# Patient Record
Sex: Female | Born: 1970 | Race: Black or African American | Hispanic: No | Marital: Married | Smoking: Never smoker
Health system: Southern US, Community
[De-identification: ages and names within clinical notes are randomized; demographics above are authoritative.]

## PROBLEM LIST (undated history)

## (undated) DIAGNOSIS — Z800 Family history of malignant neoplasm of digestive organs: Secondary | ICD-10-CM

## (undated) DIAGNOSIS — Z1371 Encounter for nonprocreative screening for genetic disease carrier status: Secondary | ICD-10-CM

## (undated) DIAGNOSIS — I10 Essential (primary) hypertension: Secondary | ICD-10-CM

## (undated) DIAGNOSIS — Z9289 Personal history of other medical treatment: Secondary | ICD-10-CM

## (undated) DIAGNOSIS — E119 Type 2 diabetes mellitus without complications: Secondary | ICD-10-CM

## (undated) DIAGNOSIS — E559 Vitamin D deficiency, unspecified: Secondary | ICD-10-CM

## (undated) DIAGNOSIS — Z8041 Family history of malignant neoplasm of ovary: Secondary | ICD-10-CM

## (undated) HISTORY — DX: Personal history of other medical treatment: Z92.89

## (undated) HISTORY — DX: Family history of malignant neoplasm of ovary: Z80.41

## (undated) HISTORY — DX: Encounter for nonprocreative screening for genetic disease carrier status: Z13.71

## (undated) HISTORY — DX: Family history of malignant neoplasm of digestive organs: Z80.0

## (undated) HISTORY — DX: Vitamin D deficiency, unspecified: E55.9

## (undated) HISTORY — PX: NOVASURE ABLATION: SHX5394

## (undated) HISTORY — PX: TUBAL LIGATION: SHX77

---

## 2002-04-21 HISTORY — PX: INTRAUTERINE DEVICE (IUD) INSERTION: SHX5877

## 2006-11-28 ENCOUNTER — Emergency Department: Payer: Self-pay | Admitting: Emergency Medicine

## 2007-08-20 ENCOUNTER — Emergency Department: Payer: Self-pay | Admitting: Emergency Medicine

## 2007-10-19 ENCOUNTER — Encounter: Payer: Self-pay | Admitting: General Practice

## 2007-10-20 ENCOUNTER — Encounter: Payer: Self-pay | Admitting: General Practice

## 2007-12-21 ENCOUNTER — Ambulatory Visit: Payer: Self-pay

## 2008-01-19 ENCOUNTER — Ambulatory Visit: Payer: Self-pay | Admitting: Pain Medicine

## 2008-01-25 ENCOUNTER — Ambulatory Visit: Payer: Self-pay | Admitting: Pain Medicine

## 2008-02-08 ENCOUNTER — Ambulatory Visit: Payer: Self-pay | Admitting: Physician Assistant

## 2011-09-20 DIAGNOSIS — E559 Vitamin D deficiency, unspecified: Secondary | ICD-10-CM

## 2011-09-20 DIAGNOSIS — E119 Type 2 diabetes mellitus without complications: Secondary | ICD-10-CM

## 2011-09-20 HISTORY — DX: Type 2 diabetes mellitus without complications: E11.9

## 2011-09-20 HISTORY — DX: Vitamin D deficiency, unspecified: E55.9

## 2011-12-03 ENCOUNTER — Emergency Department: Payer: Self-pay | Admitting: Unknown Physician Specialty

## 2013-08-23 ENCOUNTER — Emergency Department: Payer: Self-pay | Admitting: Emergency Medicine

## 2013-08-23 LAB — CBC WITH DIFFERENTIAL/PLATELET
Basophil #: 0 10*3/uL (ref 0.0–0.1)
Basophil %: 0.3 %
EOS ABS: 0 10*3/uL (ref 0.0–0.7)
EOS PCT: 0.3 %
HCT: 41.7 % (ref 35.0–47.0)
HGB: 13.7 g/dL (ref 12.0–16.0)
LYMPHS ABS: 1.9 10*3/uL (ref 1.0–3.6)
Lymphocyte %: 11.8 %
MCH: 31.5 pg (ref 26.0–34.0)
MCHC: 33 g/dL (ref 32.0–36.0)
MCV: 96 fL (ref 80–100)
Monocyte #: 0.7 x10 3/mm (ref 0.2–0.9)
Monocyte %: 4.3 %
NEUTROS ABS: 13.5 10*3/uL — AB (ref 1.4–6.5)
Neutrophil %: 83.3 %
PLATELETS: 248 10*3/uL (ref 150–440)
RBC: 4.36 10*6/uL (ref 3.80–5.20)
RDW: 12.6 % (ref 11.5–14.5)
WBC: 16.2 10*3/uL — ABNORMAL HIGH (ref 3.6–11.0)

## 2013-08-23 LAB — URINALYSIS, COMPLETE
BACTERIA: NONE SEEN
BLOOD: NEGATIVE
Bilirubin,UR: NEGATIVE
Glucose,UR: >=500 mg/dL (ref 0–75)
Leukocyte Esterase: NEGATIVE
NITRITE: NEGATIVE
Ph: 5 (ref 4.5–8.0)
Protein: 30
RBC,UR: 1 /HPF (ref 0–5)
SPECIFIC GRAVITY: 1.031 (ref 1.003–1.030)
WBC UR: 2 /HPF (ref 0–5)

## 2013-08-23 LAB — COMPREHENSIVE METABOLIC PANEL
ALK PHOS: 56 U/L
Albumin: 3.5 g/dL (ref 3.4–5.0)
Anion Gap: 7 (ref 7–16)
BILIRUBIN TOTAL: 0.5 mg/dL (ref 0.2–1.0)
BUN: 13 mg/dL (ref 7–18)
CALCIUM: 8.6 mg/dL (ref 8.5–10.1)
CHLORIDE: 105 mmol/L (ref 98–107)
CO2: 25 mmol/L (ref 21–32)
Creatinine: 0.79 mg/dL (ref 0.60–1.30)
Glucose: 232 mg/dL — ABNORMAL HIGH (ref 65–99)
OSMOLALITY: 281 (ref 275–301)
Potassium: 3.7 mmol/L (ref 3.5–5.1)
SGOT(AST): 20 U/L (ref 15–37)
SGPT (ALT): 15 U/L (ref 12–78)
Sodium: 137 mmol/L (ref 136–145)
Total Protein: 7.2 g/dL (ref 6.4–8.2)

## 2013-08-23 LAB — LIPASE, BLOOD: Lipase: 130 U/L (ref 73–393)

## 2013-10-31 LAB — HM MAMMOGRAPHY

## 2013-10-31 LAB — HM PAP SMEAR

## 2016-12-13 ENCOUNTER — Encounter: Payer: Self-pay | Admitting: Emergency Medicine

## 2016-12-13 ENCOUNTER — Emergency Department
Admission: EM | Admit: 2016-12-13 | Discharge: 2016-12-13 | Disposition: A | Discharge status: Home / Self Care | Payer: No Typology Code available for payment source | Attending: Emergency Medicine | Admitting: Emergency Medicine

## 2016-12-13 DIAGNOSIS — M545 Low back pain: Secondary | ICD-10-CM | POA: Diagnosis present

## 2016-12-13 DIAGNOSIS — E119 Type 2 diabetes mellitus without complications: Secondary | ICD-10-CM | POA: Diagnosis not present

## 2016-12-13 DIAGNOSIS — M7918 Myalgia, other site: Secondary | ICD-10-CM

## 2016-12-13 DIAGNOSIS — M791 Myalgia: Secondary | ICD-10-CM | POA: Diagnosis not present

## 2016-12-13 HISTORY — DX: Type 2 diabetes mellitus without complications: E11.9

## 2016-12-13 MED ORDER — CYCLOBENZAPRINE HCL 5 MG PO TABS: 5.0000 mg | ORAL_TABLET | Freq: Three times a day (TID) | ORAL | 0 refills | Status: DC | PRN
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
## 2016-12-13 NOTE — ED Notes (Signed)
NAD noted at time of D/C. Pt denies questions or concerns. Pt ambulatory to the lobby at this time.  
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
## 2017-05-13 ENCOUNTER — Encounter: Payer: Self-pay | Admitting: Maternal Newborn

## 2017-05-13 ENCOUNTER — Ambulatory Visit: Payer: Self-pay | Admitting: Maternal Newborn

## 2017-05-13 ENCOUNTER — Ambulatory Visit (INDEPENDENT_AMBULATORY_CARE_PROVIDER_SITE_OTHER): Payer: BLUE CROSS/BLUE SHIELD | Admitting: Maternal Newborn

## 2017-05-13 VITALS — BP 150/86 | HR 72 | Ht 66.0 in | Wt 217.0 lb

## 2017-05-13 DIAGNOSIS — Z124 Encounter for screening for malignant neoplasm of cervix: Secondary | ICD-10-CM | POA: Diagnosis not present

## 2017-05-13 DIAGNOSIS — N950 Postmenopausal bleeding: Secondary | ICD-10-CM | POA: Diagnosis not present

## 2017-05-13 DIAGNOSIS — Z01419 Encounter for gynecological examination (general) (routine) without abnormal findings: Secondary | ICD-10-CM | POA: Diagnosis not present

## 2017-05-13 DIAGNOSIS — Z1231 Encounter for screening mammogram for malignant neoplasm of breast: Secondary | ICD-10-CM | POA: Diagnosis not present

## 2017-05-13 DIAGNOSIS — R102 Pelvic and perineal pain: Secondary | ICD-10-CM | POA: Diagnosis not present

## 2017-05-13 NOTE — Progress Notes (Signed)
Gynecology Annual Exam  PCP: Lenard Simmer, MD  Chief Complaint:  Chief Complaint  Patient presents with  . Gynecologic Exam    cramping since last night - spotting deep red color this month; cramping every month for several months    History of Present Illness: Patient is a 47 y.o. [id number] presents for annual exam. The patient has had intermittent pelvic pain monthly over the past year. She describes the pain as cramping, lasting about 2-3 days per month, usually more on the right side but recently also on the left. She experienced some deep red spotting with the pain for the first time this month. She denies previous vaginal bleeding and dyspareunia. She has used NSAIDs during episodes of cramping with minimal relief.  LMP: No LMP recorded. Patient is postmenopausal. Postcoital Bleeding: no Dysmenorrhea: not applicable   The patient is sexually active. She currently uses tubal ligation for contraception. She denies dyspareunia.  The patient does not perform self breast exams.  There is notable family history of breast or ovarian cancer in her family.  The patient wears seatbelts: yes.   The patient has regular exercise: no.    The patient denies current symptoms of depression.    Review of Systems  Constitutional: Negative for chills, fever and malaise/fatigue.  HENT: Positive for nosebleeds and sinus pain.   Eyes: Negative.   Respiratory: Negative for cough, shortness of breath and wheezing.   Cardiovascular: Negative for chest pain and palpitations.  Gastrointestinal: Negative for abdominal pain, constipation, diarrhea, heartburn and nausea.  Genitourinary: Negative for dysuria, frequency and urgency.  Musculoskeletal: Positive for joint pain.       Left knee pain  Skin: Negative.   Neurological: Negative for sensory change and headaches.  Endo/Heme/Allergies: Negative.   Psychiatric/Behavioral: Negative for depression. The patient is not nervous/anxious.   All other  systems reviewed and are negative.   Past Medical History:  Past Medical History:  Diagnosis Date  . BRCA negative   . Diabetes mellitus without complication ([place]) 12/7351  . Family history of colon cancer    COLARIS NOT DONE D/T NOT COVERED IN 2014 D/T FAM HX  . Family history of ovarian cancer   . History of mammogram 09/23/2011; 10/31/13   AT [facility]; BENIGN  . History of Papanicolaou smear of cervix 2006; 10/31/13   -/-; -/-  . Vitamin D deficiency 09/2011    Past Surgical History:  Past Surgical History:  Procedure Laterality Date  . INTRAUTERINE DEVICE (IUD) INSERTION  2004   MIRENA  . NOVASURE ABLATION    . TUBAL LIGATION     PP    Gynecologic History:  No LMP recorded. Patient is postmenopausal. Contraception: tubal ligation Last Pap: 02/13/2015 Results were: NIL and HR HPV negative  Last mammogram:03/05/2015 Results were: BI-RAD II Obstetric History: [id number]  Family History:  Family History  Problem Relation Age of Onset  . Cancer Mother 81       COLON  . Diabetes Mother   . Breast cancer Paternal Aunt 74  . Cancer Maternal Grandmother 13       COLON; OVARY-80  . Breast cancer Paternal Grandmother     Social History:  Social History   Socioeconomic History  . Marital status: Married    Spouse name: Not on file  . Number of children: 3  . Years of education: 67  . Highest education level: Not on file  Social Needs  . Financial resource strain: Not on file  .  Food insecurity - worry: Not on file  . Food insecurity - inability: Not on file  . Transportation needs - medical: Not on file  . Transportation needs - non-medical: Not on file  Occupational History  . Not on file  Tobacco Use  . Smoking status: Never Smoker  . Smokeless tobacco: Never Used  Substance and Sexual Activity  . Alcohol use: No    Frequency: Never  . Drug use: No  . Sexual activity: Yes    Birth control/protection: Post-menopausal  Other Topics Concern  . Not on file    Social History Narrative  . Not on file    Allergies:  Allergies  Allergen Reactions  . Penicillins Other (See Comments)    Unknown, has been told since she was a child  . Sulfa Antibiotics     Medications: Prior to Admission medications   Medication Sig Start Date End Date Taking? Authorizing Provider  Acetaminophen-Caff-Pyrilamine (MIDOL COMPLETE) 500-60-15 MG TABS Take 2 tablets by mouth every 6 (six) hours.   Yes [provider]  JARDIANCE 25 MG TABS tablet Take 25 mg by mouth daily. 04/21/17  Yes [provider]  metFORMIN (GLUCOPHAGE) 1000 MG tablet Take 1,000 mg by mouth 2 (two) times daily. 04/18/17  Yes [provider]  ONETOUCH VERIO test strip Apply 1 strip topically daily. 03/18/17  Yes [provider]  [place] 100-33 UNT-MCG/ML SOPN Inject 3 Units into the skin daily. 03/18/17  Yes [provider]  Vitamin D, Ergocalciferol, (DRISDOL) 50000 units CAPS capsule Take 50,000 Units by mouth once a week. 04/18/17  Yes [provider]    Physical Exam Vitals: Blood pressure (!) 150/86, pulse 72, height '5\' 6"'$  (1.676 m), weight 217 lb (98.4 kg).  General: NAD HEENT: normocephalic, anicteric Thyroid: no enlargement, no palpable nodules Pulmonary: No increased work of breathing, CTAB Cardiovascular: RRR, S1 and S2 auscultated, no murmur, rub or gallop Breast: Breasts symmetrical, no tenderness, no palpable nodules or masses, no skin or nipple retraction present, no nipple discharge.  No axillary or supraclavicular lymphadenopathy. Abdomen: soft, non-tender, non-distended.  Umbilicus without lesions.  No hepatomegaly, splenomegaly or masses palpable. No evidence of hernia  Genitourinary:  External: Normal external female genitalia.  Normal urethral  meatus, normal Bartholin's and Skene's glands.    Vagina: Normal vaginal mucosa, no evidence of prolapse.    Cervix: Grossly normal in appearance, small amount of  bleeding, dark  red  Uterus: Non-enlarged, mobile, normal contour.  No CMT  Adnexa: ovaries non-enlarged, no adnexal masses  Rectal: deferred  Lymphatic: no evidence of inguinal lymphadenopathy Extremities: no edema, erythema, or tenderness Neurologic: Grossly intact Psychiatric: mood appropriate, affect full  Assessment: 47 y.o. G3P3003 routine annual exam with pelvic pain and postmenopausal bleeding.  Plan: Problem List Items Addressed This Visit    None    Visit Diagnoses    Encounter for annual routine gynecological examination    -  Primary   Pap smear for cervical cancer screening       Relevant Orders   Pap IG and HPV (high risk) DNA detection   Pelvic pain       Relevant Orders   US PELVIS TRANSVANGINAL NON-OB (TV ONLY)   Encounter for screening mammogram for breast cancer       Relevant Orders   MM DIGITAL SCREENING BILATERAL   Postmenopausal bleeding          1) Mammogram - recommend yearly screening mammogram.  Mammogram was ordered today. Patient given  card and advised to call to schedule an appointment with [place] at [facility].  2) STI screening was offered and declined.  3) ASCCP guidelines and rational discussed.  Patient opts for every 3 year screening interval.  4) Contraception - Patient has had a tubal ligation.  5) Colonoscopy -- Screening recommended starting at age 46 for average risk individuals, age 15 for individuals deemed at increased risk (including African Americans) and recommended to continue until age 33.  For patient age 64-85 individualized approach is recommended.  Gold standard screening is via colonoscopy, Cologuard screening is an acceptable alternative for patient unwilling or unable to undergo colonoscopy.  "Colorectal cancer screening for average?risk adults: 2018 guideline update from the American Cancer Society"CA: A Cancer Journal for Clinicians: Sep 17, 2016   6) Routine healthcare maintenance including cholesterol, diabetes screening  discussed; managed by PCP.  7) Follow up as soon as patient's schedule permits with pelvic ultrasound for pelvic pain and postmenopausal bleeding.  Return in about 2 days (around 05/15/2017) for Pelvic pain with ultrasound.  Avel Sensor, CNM 05/13/2017  3:50 PM

## 2017-05-15 LAB — PAP IG AND HPV HIGH-RISK
HPV, HIGH-RISK: NEGATIVE
PAP Smear Comment: 0

## 2017-05-21 ENCOUNTER — Encounter: Payer: Self-pay | Admitting: Maternal Newborn

## 2017-05-21 ENCOUNTER — Ambulatory Visit (INDEPENDENT_AMBULATORY_CARE_PROVIDER_SITE_OTHER): Payer: BLUE CROSS/BLUE SHIELD | Admitting: Maternal Newborn

## 2017-05-21 ENCOUNTER — Ambulatory Visit (INDEPENDENT_AMBULATORY_CARE_PROVIDER_SITE_OTHER): Payer: BLUE CROSS/BLUE SHIELD

## 2017-05-21 VITALS — BP 140/96 | HR 66 | Ht 66.0 in | Wt 215.0 lb

## 2017-05-21 DIAGNOSIS — N8003 Adenomyosis of the uterus: Secondary | ICD-10-CM | POA: Insufficient documentation

## 2017-05-21 DIAGNOSIS — R102 Pelvic and perineal pain: Secondary | ICD-10-CM

## 2017-05-21 DIAGNOSIS — N809 Endometriosis, unspecified: Principal | ICD-10-CM

## 2017-05-21 DIAGNOSIS — N800 Endometriosis of uterus: Secondary | ICD-10-CM

## 2017-05-21 NOTE — Progress Notes (Signed)
Obstetrics & Gynecology Office Visit   Chief Complaint: Follow up with ultrasound.  History of Present Illness: She has had intermittent cramping and pelvic pain monthly over the past year, lasting anywhere from 2-5 days. Some relief with Gays powder and acetaminophen. One episode of red spotting in January. No bloating or bowel changes. Of note, patient's records state that she is post-menopausal, but she has a history of a uterine ablation.  Review of Systems: 10 point review of systems negative unless otherwise noted in HPI  Past Medical History:  Past Medical History:  Diagnosis Date  . BRCA negative   . Diabetes mellitus without complication ([place]) 03/4579  . Family history of colon cancer    COLARIS NOT DONE D/T NOT COVERED IN 2014 D/T FAM HX  . Family history of ovarian cancer   . History of mammogram 09/23/2011; 10/31/13   AT [facility]; BENIGN  . History of Papanicolaou smear of cervix 2006; 10/31/13   -/-; -/-  . Vitamin D deficiency 09/2011    Past Surgical History:  Past Surgical History:  Procedure Laterality Date  . INTRAUTERINE DEVICE (IUD) INSERTION  2004   MIRENA  . NOVASURE ABLATION    . TUBAL LIGATION     PP    Gynecologic History: No LMP recorded. Patient is postmenopausal.  Obstetric History: [id number]  Family History:  Family History  Problem Relation Age of Onset  . Cancer Mother 24       COLON  . Diabetes Mother   . Breast cancer Paternal Aunt 74  . Cancer Maternal Grandmother 62       COLON; OVARY-80  . Breast cancer Paternal Grandmother     Social History:  Social History   Socioeconomic History  . Marital status: Married    Spouse name: Not on file  . Number of children: 3  . Years of education: 41  . Highest education level: Not on file  Social Needs  . Financial resource strain: Not on file  . Food insecurity - worry: Not on file  . Food insecurity - inability: Not on file  . Transportation needs - medical: Not on file  .  Transportation needs - non-medical: Not on file  Occupational History  . Not on file  Tobacco Use  . Smoking status: Never Smoker  . Smokeless tobacco: Never Used  Substance and Sexual Activity  . Alcohol use: No    Frequency: Never  . Drug use: No  . Sexual activity: Yes    Birth control/protection: Post-menopausal  Other Topics Concern  . Not on file  Social History Narrative  . Not on file    Allergies:  Allergies  Allergen Reactions  . Penicillins Other (See Comments)    Unknown, has been told since she was a child  . Sulfa Antibiotics     Medications: Prior to Admission medications   Medication Sig Start Date End Date Taking? Authorizing Provider  Acetaminophen-Caff-Pyrilamine (MIDOL COMPLETE) 500-60-15 MG TABS Take 2 tablets by mouth every 6 (six) hours.    [provider]  JARDIANCE 25 MG TABS tablet Take 25 mg by mouth daily. 04/21/17   [provider]  metFORMIN (GLUCOPHAGE) 1000 MG tablet Take 1,000 mg by mouth 2 (two) times daily. 04/18/17   [provider]  Glory Rosebush VERIO test strip Apply 1 strip topically daily. 03/18/17   [provider]  SOLIQUA 100-33 UNT-MCG/ML SOPN Inject 3 Units into the skin daily. 03/18/17   [provider]  Vitamin  D, Ergocalciferol, (DRISDOL) 50000 units CAPS capsule Take 50,000 Units by mouth once a week. 04/18/17   [provider]    Physical Exam Vitals: Blood pressure (!) 140/96, pulse 66, height '5\' 6"'$  (1.676 m), weight 215 lb (97.5 kg). No LMP recorded. Patient is postmenopausal.  General: NAD HEENT: normocephalic, anicteric Pulmonary: No increased work of breathing Neurologic: Grossly intact Psychiatric: mood appropriate, affect full  Assessment: 47 y.o. P2T6244 presents for follow-up visit with ultrasound due to pelvic pain and an episode of spotting.  Plan: Problem List Items Addressed This Visit      Other   Adenomyosis - Primary     Ultrasound today showed  normal endometrial thickness at 3.29 mm. Subendometrial cysts and heterogenous texture are suggestive of adenomyosis. No adnexal masses, normal appearing ovaries.  Patient informed of these results and Pap results NILM/HPV Negative. Discussed NSAIDs for relief, but she is currently using Meloxicam and cannot take additional ibuprofen. Using aspirin/Tylenol with some relief. Encouraged heat application during episodes of cramping.   We spoke about adenomyosis and written material was given, patient understands that diagnosis is tentative without surgical confirmation. Reviewed surgical management such as uterine ablation and hysterectomy, and she does not desire to pursue these options now.   Encouraged to return to care for further episodes of vaginal bleeding as an endometrial biopsy may be indicated. Otherwise, she will follow up for her next annual exam.  A total of 10 minutes were spent in face-to-face contact with the patient during this encounter with over half of that time devoted to counseling and coordination of care.  Avel Sensor, CNM 05/21/2017  9:45 PM

## 2017-06-03 ENCOUNTER — Ambulatory Visit
Admission: RE | Admit: 2017-06-03 | Discharge: 2017-06-03 | Disposition: A | Discharge status: Home / Self Care | Payer: BLUE CROSS/BLUE SHIELD | Source: Ambulatory Visit | Attending: Maternal Newborn | Admitting: Maternal Newborn

## 2017-06-03 ENCOUNTER — Ambulatory Visit: Payer: BLUE CROSS/BLUE SHIELD

## 2017-06-03 ENCOUNTER — Ambulatory Visit (INDEPENDENT_AMBULATORY_CARE_PROVIDER_SITE_OTHER): Payer: BLUE CROSS/BLUE SHIELD

## 2017-06-03 DIAGNOSIS — Z1231 Encounter for screening mammogram for malignant neoplasm of breast: Secondary | ICD-10-CM | POA: Insufficient documentation

## 2017-06-03 DIAGNOSIS — Z23 Encounter for immunization: Secondary | ICD-10-CM

## 2017-06-03 MED ORDER — TETANUS-DIPHTH-ACELL PERTUSSIS 5-2.5-18.5 LF-MCG/0.5 IM SUSP
0.5000 mL | Freq: Once | INTRAMUSCULAR | Status: AC
  Administered 2017-06-03: 0.5 mL via INTRAMUSCULAR

## 2017-06-03 NOTE — Progress Notes (Signed)
Pt here for Tdap inj which was given IM left deltoid. NDC# [id number]

## 2017-06-04 ENCOUNTER — Other Ambulatory Visit: Payer: Self-pay | Admitting: *Deleted

## 2017-06-04 ENCOUNTER — Inpatient Hospital Stay
Admission: RE | Admit: 2017-06-04 | Discharge: 2017-06-04 | Disposition: A | Discharge status: Home / Self Care | Payer: Self-pay | Source: Ambulatory Visit | Attending: *Deleted | Admitting: *Deleted

## 2017-06-04 DIAGNOSIS — Z9289 Personal history of other medical treatment: Secondary | ICD-10-CM

## 2018-05-25 ENCOUNTER — Other Ambulatory Visit: Payer: Self-pay | Admitting: Advanced Practice Midwife

## 2018-06-03 ENCOUNTER — Ambulatory Visit: Payer: BLUE CROSS/BLUE SHIELD | Admitting: Maternal Newborn

## 2018-06-03 ENCOUNTER — Ambulatory Visit (INDEPENDENT_AMBULATORY_CARE_PROVIDER_SITE_OTHER): Payer: BLUE CROSS/BLUE SHIELD | Admitting: Advanced Practice Midwife

## 2018-06-03 ENCOUNTER — Encounter: Payer: Self-pay | Admitting: Advanced Practice Midwife

## 2018-06-03 VITALS — BP 124/80 | HR 71 | Ht 66.5 in | Wt 214.0 lb

## 2018-06-03 DIAGNOSIS — Z1239 Encounter for other screening for malignant neoplasm of breast: Secondary | ICD-10-CM

## 2018-06-03 DIAGNOSIS — R102 Pelvic and perineal pain: Secondary | ICD-10-CM

## 2018-06-03 DIAGNOSIS — Z01419 Encounter for gynecological examination (general) (routine) without abnormal findings: Secondary | ICD-10-CM

## 2018-06-03 MED ORDER — NORETHINDRONE 0.35 MG PO TABS: 1.0000 | ORAL_TABLET | Freq: Every day | ORAL | 4 refills | Status: DC

## 2018-06-03 NOTE — Progress Notes (Signed)
Patient ID: Alice Rodriguez, female   DOB: June 15, 1970, 48 y.o.   MRN: [id number]    Gynecology Annual Exam  PCP: Lenard Simmer, MD  Chief Complaint:  Chief Complaint  Patient presents with  . Gynecologic Exam    Every month around around end of the month or begining she is having cramping, nausea and vomiting    History of Present Illness: Patient is a 48 y.o. [name] presents for annual exam. The patient has complaint today of ongoing pelvic cramping. She has 1-3 days per month of cramping with nausea and vomiting. For the 2nd and 3rd days she has very light spotting. The pain is bilateral in her pelvis. She has had the same pain for the past year since she was last seen here for her annual exam. She has tried NSAIDs with some relief. She has not tried heat but she does say the pain is not as bad if she is warm instead of cold. Last year imaging was suggestive of Adenomyosis. She is agreeable to trying progesterone-only pill to disrupt ovulation in case that this the cause of the pain.   Discussed alternative therapies such as massage and acupuncture for her pelvic pain.  LMP: No LMP recorded. Patient has had uterine ablation  Postcoital Bleeding: no Dysmenorrhea: yes   The patient is sexually active. She currently uses tubal ligation for contraception. She denies dyspareunia but does admit vaginal dryness.  The patient does perform self breast exams.  There is notable family history of breast or ovarian cancer in her family. Her maternal grandmother had ovarian cancer.   The patient wears seatbelts: yes.   The patient has regular exercise: She walks at work when on break. She also takes the stairs. She has made some improvements in her diet to decrease carbohydrate intake but still has sweet tea daily.    The patient denies current symptoms of depression.    Review of Systems: Review of Systems  Constitutional: Negative.   HENT: Negative.   Eyes: Negative.   Respiratory:  Negative.   Cardiovascular: Negative.   Gastrointestinal: Positive for nausea and vomiting.  Genitourinary:       Cramping   Musculoskeletal: Negative.   Skin: Negative.   Neurological: Negative.   Endo/Heme/Allergies: Negative.   Psychiatric/Behavioral: Negative.     Past Medical History:  Past Medical History:  Diagnosis Date  . BRCA negative   . Diabetes mellitus without complication ([place]) 29/4765  . Family history of colon cancer    COLARIS NOT DONE D/T NOT COVERED IN 2014 D/T FAM HX  . Family history of ovarian cancer   . History of mammogram 09/23/2011; 10/31/13   AT [facility]; BENIGN  . History of Papanicolaou smear of cervix 2006; 10/31/13   -/-; -/-  . Vitamin D deficiency 09/2011    Past Surgical History:  Past Surgical History:  Procedure Laterality Date  . INTRAUTERINE DEVICE (IUD) INSERTION  2004   MIRENA  . NOVASURE ABLATION    . TUBAL LIGATION     PP    Gynecologic History:  No LMP recorded. Patient has had uterine ablation Contraception: tubal ligation Last Pap: 1 year ago Results were:  no abnormalities  Last mammogram: 1 year ago Results were: BI-RAD I  Obstetric History: [id number]  Family History:  Family History  Problem Relation Age of Onset  . Cancer Mother 91       COLON  . Diabetes Mother   . Breast cancer Paternal Aunt 74  .  Cancer Maternal Grandmother 55       COLON; OVARY-80  . Breast cancer Maternal Grandmother   . Breast cancer Paternal Grandmother 40    Social History:  Social History   Socioeconomic History  . Marital status: Married    Spouse name: Not on file  . Number of children: 3  . Years of education: 42  . Highest education level: Not on file  Occupational History  . Not on file  Social Needs  . Financial resource strain: Not on file  . Food insecurity:    Worry: Not on file    Inability: Not on file  . Transportation needs:    Medical: Not on file    Non-medical: Not on file  Tobacco Use  . Smoking status:  Never Smoker  . Smokeless tobacco: Never Used  Substance and Sexual Activity  . Alcohol use: No    Frequency: Never  . Drug use: No  . Sexual activity: Yes    Birth control/protection: Post-menopausal  Lifestyle  . Physical activity:    Days per week: Not on file    Minutes per session: Not on file  . Stress: Not on file  Relationships  . Social connections:    Talks on phone: Not on file    Gets together: Not on file    Attends religious service: Not on file    Active member of club or organization: Not on file    Attends meetings of clubs or organizations: Not on file    Relationship status: Not on file  . Intimate partner violence:    Fear of current or ex partner: Not on file    Emotionally abused: Not on file    Physically abused: Not on file    Forced sexual activity: Not on file  Other Topics Concern  . Not on file  Social History Narrative  . Not on file    Allergies:  Allergies  Allergen Reactions  . Penicillins Other (See Comments)    Unknown, has been told since she was a child  . Sulfa Antibiotics     Medications: Prior to Admission medications   Medication Sig Start Date End Date Taking? Authorizing Provider  Acetaminophen-Caff-Pyrilamine (MIDOL COMPLETE) 500-60-15 MG TABS Take 2 tablets by mouth every 6 (six) hours.   Yes [provider]  JARDIANCE 25 MG TABS tablet Take 25 mg by mouth daily. 04/21/17  Yes [provider]  metFORMIN (GLUCOPHAGE) 1000 MG tablet Take 1,000 mg by mouth 2 (two) times daily. 04/18/17  Yes [provider]  ONETOUCH VERIO test strip Apply 1 strip topically daily. 03/18/17  Yes [provider]  Elmo 100-33 UNT-MCG/ML SOPN Inject 3 Units into the skin daily. 03/18/17  Yes [provider]  Vitamin D, Ergocalciferol, (DRISDOL) 50000 units CAPS capsule Take 50,000 Units by mouth once a week. 04/18/17  Yes [provider]  norethindrone (MICRONOR,CAMILA,ERRIN) 0.35 MG tablet Take  1 tablet (0.35 mg total) by mouth daily. 06/03/18   Rod Can, CNM    Physical Exam Vitals: Blood pressure 124/80, pulse 71, height 5' 6.5" (1.689 m), weight 214 lb (97.1 kg).  General: NAD HEENT: normocephalic, anicteric Thyroid: no enlargement, no palpable nodules Pulmonary: No increased work of breathing, CTAB Cardiovascular: RRR, distal pulses 2+ Breast: Breast symmetrical, no tenderness, no palpable nodules or masses, no skin or nipple retraction present, no nipple discharge.  No axillary or supraclavicular lymphadenopathy. Abdomen: NABS, soft, non-tender, non-distended.  Umbilicus without lesions.  No hepatomegaly,  splenomegaly or masses palpable. No evidence of hernia  Genitourinary:  External: Normal external female genitalia.  Normal urethral meatus, normal Bartholin's and Skene's glands.    Vagina: Normal vaginal mucosa, no evidence of prolapse.    Cervix: Grossly normal in appearance, no bleeding  Uterus: Non-enlarged, mobile, normal contour.  No CMT  Adnexa: ovaries non-enlarged, no adnexal masses  Rectal: deferred  Lymphatic: no evidence of inguinal lymphadenopathy Extremities: no edema, erythema, or tenderness Neurologic: Grossly intact Psychiatric: mood appropriate, affect full     Assessment: 48 y.o. G3P3003 routine annual exam  Plan: Problem List Items Addressed This Visit    None    Visit Diagnoses    Well woman exam with routine gynecological exam    -  Primary   Pelvic pain       Relevant Medications   norethindrone (MICRONOR,CAMILA,ERRIN) 0.35 MG tablet   Breast cancer screening          1) Mammogram - recommend yearly screening mammogram.  Mammogram Was ordered today   2) STI screening  was offered and declined  3) ASCCP guidelines and rationale discussed.  Patient opts for every 3 years screening interval  4) Contraception - the patient is currently using  tubal ligation.  She is happy with her current form of contraception and plans to  continue  5) Colonoscopy Per PCP-- Screening recommended starting at age 44 for average risk individuals, age 31 for individuals deemed at increased risk (including African Americans) and recommended to continue until age 2.  For patient age 66-85 individualized approach is recommended.  Gold standard screening is via colonoscopy, Cologuard screening is an acceptable alternative for patient unwilling or unable to undergo colonoscopy.  "Colorectal cancer screening for average?risk adults: 2018 guideline update from the American Cancer Society"CA: A Cancer Journal for Clinicians: Sep 17, 2016   6) Routine healthcare maintenance including cholesterol, diabetes screening discussed managed by PCP   7) Coconut oil for lubrication  8) Return in 1 year (on 06/04/2019) for annual established gyn.   Rod Can, [facility], [facility] 06/03/2018, 2:33 PM

## 2018-06-03 NOTE — Patient Instructions (Signed)

## 2018-06-04 ENCOUNTER — Other Ambulatory Visit: Payer: Self-pay | Admitting: Advanced Practice Midwife

## 2018-06-07 ENCOUNTER — Encounter: Payer: Self-pay | Admitting: Obstetrics and Gynecology

## 2018-07-01 ENCOUNTER — Ambulatory Visit: Payer: BLUE CROSS/BLUE SHIELD | Admitting: Maternal Newborn

## 2018-07-01 ENCOUNTER — Encounter: Payer: Self-pay | Admitting: Certified Nurse Midwife

## 2019-03-03 DIAGNOSIS — M722 Plantar fascial fibromatosis: Secondary | ICD-10-CM | POA: Insufficient documentation

## 2019-08-04 ENCOUNTER — Other Ambulatory Visit: Payer: Self-pay | Admitting: Advanced Practice Midwife

## 2019-08-04 DIAGNOSIS — R102 Pelvic and perineal pain: Secondary | ICD-10-CM

## 2019-08-25 ENCOUNTER — Ambulatory Visit (INDEPENDENT_AMBULATORY_CARE_PROVIDER_SITE_OTHER): Payer: BC Managed Care – PPO | Admitting: Advanced Practice Midwife

## 2019-08-25 ENCOUNTER — Encounter: Payer: Self-pay | Admitting: Advanced Practice Midwife

## 2019-08-25 ENCOUNTER — Other Ambulatory Visit: Payer: Self-pay

## 2019-08-25 VITALS — BP 118/84 | Ht 66.0 in | Wt 238.0 lb

## 2019-08-25 DIAGNOSIS — Z1211 Encounter for screening for malignant neoplasm of colon: Secondary | ICD-10-CM

## 2019-08-25 DIAGNOSIS — Z0000 Encounter for general adult medical examination without abnormal findings: Secondary | ICD-10-CM

## 2019-08-25 DIAGNOSIS — R102 Pelvic and perineal pain: Secondary | ICD-10-CM

## 2019-08-25 DIAGNOSIS — Z1239 Encounter for other screening for malignant neoplasm of breast: Secondary | ICD-10-CM

## 2019-08-25 MED ORDER — NORETHINDRONE 0.35 MG PO TABS: 1.0000 | ORAL_TABLET | Freq: Every day | ORAL | 4 refills | Status: DC

## 2019-08-25 NOTE — Progress Notes (Signed)
Gynecology Annual Exam  Date of Service: 08/25/2019  PCP: Lenard Simmer, MD  Chief Complaint:  Chief Complaint  Patient presents with  . Gynecologic Exam    History of Present Illness: Patient is a 49 y.o. G3P3003 presents for annual exam. The patient has no gyn complaints today. She reports progesterone only pills have been relieving her abdominal pain. She requests refill.   We discussed recommendation for colonoscopy. She has a concern regarding procedure. Her husband's aunt died while having colonoscopy, she says related to the procedure. She accepts referral and would like to discuss her concerns with GI.  She reports an improvement in her Hgb A1C per labs done with PCP.  LMP: No LMP recorded. Patient has had uterine ablation. She has spotting every 2-3 months Intermenstrual Bleeding: not applicable Postcoital Bleeding: no Dysmenorrhea: no   The patient is sexually active. She currently uses tubal ligation for contraception. She denies dyspareunia.  The patient does perform self breast exams.  There is notable family history of breast or ovarian cancer in her family. The patient is BRCA negative.  The patient wears seatbelts: yes.   The patient has regular exercise: she walks and has a stationary bike. She has quit eating fast food and has increased intake of salad/grilled chicken/shrimp. She drinks water and adds flavor packets. She denies adequate sleep as she has 2 jobs.    The patient denies current symptoms of depression.    Review of Systems: Review of Systems  Constitutional: Negative for chills and fever.  HENT: Negative for congestion, ear discharge, ear pain, hearing loss, sinus pain and sore throat.   Eyes: Negative for blurred vision and double vision.  Respiratory: Negative for cough, shortness of breath and wheezing.   Cardiovascular: Negative for chest pain, palpitations and leg swelling.  Gastrointestinal: Negative for abdominal pain, blood in stool,  constipation, diarrhea, heartburn, melena, nausea and vomiting.  Genitourinary: Negative for dysuria, flank pain, frequency, hematuria and urgency.  Musculoskeletal: Negative for back pain, joint pain and myalgias.  Skin: Negative for itching and rash.  Neurological: Negative for dizziness, tingling, tremors, sensory change, speech change, focal weakness, seizures, loss of consciousness, weakness and headaches.  Endo/Heme/Allergies: Negative for environmental allergies. Does not bruise/bleed easily.  Psychiatric/Behavioral: Negative for depression, hallucinations, memory loss, substance abuse and suicidal ideas. The patient is not nervous/anxious and does not have insomnia.     Past Medical History:  Patient Active Problem List   Diagnosis Date Noted  . Adenomyosis 05/21/2017    Past Surgical History:  Past Surgical History:  Procedure Laterality Date  . INTRAUTERINE DEVICE (IUD) INSERTION  2004   MIRENA  . NOVASURE ABLATION    . TUBAL LIGATION     PP    Gynecologic History:  No LMP recorded. Patient is postmenopausal. Contraception: tubal ligation Last Pap: 2 years ago Results were:  no abnormalities  Last mammogram: 1 year ago Results were: BI-RAD II benign  Obstetric History: [id number]  Family History:  Family History  Problem Relation Age of Onset  . Cancer Mother 24       COLON  . Diabetes Mother   . Breast cancer Paternal Aunt 74  . Cancer Maternal Grandmother 99       COLON; OVARY-80  . Breast cancer Maternal Grandmother   . Breast cancer Paternal Grandmother 76    Social History:  Social History   Socioeconomic History  . Marital status: Married    Spouse name: Not on file  .  Number of children: 3  . Years of education: 6  . Highest education level: Not on file  Occupational History  . Not on file  Tobacco Use  . Smoking status: Never Smoker  . Smokeless tobacco: Never Used  Substance and Sexual Activity  . Alcohol use: No  . Drug use: No  .  Sexual activity: Yes    Birth control/protection: Post-menopausal  Other Topics Concern  . Not on file  Social History Narrative  . Not on file   Social Determinants of Health   Financial Resource Strain:   . Difficulty of Paying Living Expenses:   Food Insecurity:   . Worried About Charity fundraiser in the Last Year:   . Arboriculturist in the Last Year:   Transportation Needs:   . Film/video editor (Medical):   Marland Kitchen Lack of Transportation (Non-Medical):   Physical Activity:   . Days of Exercise per Week:   . Minutes of Exercise per Session:   Stress:   . Feeling of Stress :   Social Connections:   . Frequency of Communication with Friends and Family:   . Frequency of Social Gatherings with Friends and Family:   . Attends Religious Services:   . Active Member of Clubs or Organizations:   . Attends Archivist Meetings:   Marland Kitchen Marital Status:   Intimate Partner Violence:   . Fear of Current or Ex-Partner:   . Emotionally Abused:   Marland Kitchen Physically Abused:   . Sexually Abused:     Allergies:  Allergies  Allergen Reactions  . Penicillins Other (See Comments)    Unknown, has been told since she was a child  . Sulfa Antibiotics     Medications: Prior to Admission medications   Medication Sig Start Date End Date Taking? Authorizing Provider  Acetaminophen-Caff-Pyrilamine (MIDOL COMPLETE) 500-60-15 MG TABS Take 2 tablets by mouth every 6 (six) hours.   Yes [provider]  norethindrone (MICRONOR) 0.35 MG tablet Take 1 tablet (0.35 mg total) by mouth daily. 08/25/19  Yes Rod Can, CNM  ONETOUCH VERIO test strip Apply 1 strip topically daily. 03/18/17  Yes [provider]  TOUJEO SOLOSTAR 300 UNIT/ML Solostar Pen Inject 40 Units into the skin in the morning. 07/24/19  Yes [provider]  TRIJARDY XR 12.5-2.08-998 MG TB24 Take 1 tablet by mouth 2 (two) times daily. 07/25/19  Yes [provider]  Vitamin D, Ergocalciferol,  (DRISDOL) 50000 units CAPS capsule Take 50,000 Units by mouth once a week. 04/18/17  Yes [provider]    Physical Exam Vitals: Blood pressure 118/84, height '5\' 6"'$  (1.676 m), weight 238 lb (108 kg).  General: NAD HEENT: normocephalic, anicteric Thyroid: no enlargement, no palpable nodules Pulmonary: No increased work of breathing, CTAB Cardiovascular: RRR, distal pulses 2+ Breast: Breast symmetrical, no tenderness, no palpable nodules or masses, no skin or nipple retraction present, no nipple discharge.  No axillary or supraclavicular lymphadenopathy. Abdomen: NABS, soft, non-tender, non-distended.  Umbilicus without lesions.  No hepatomegaly, splenomegaly or masses palpable. No evidence of hernia  Genitourinary: deferred for no concerns/PAP interval Extremities: no edema, erythema, or tenderness Neurologic: Grossly intact Psychiatric: mood appropriate, affect full    Assessment: 49 y.o. G3P3003 routine annual exam  Plan: Problem List Items Addressed This Visit    None    Visit Diagnoses    Well woman exam without gynecological exam    -  Primary   Relevant Orders   Ambulatory referral to  Gastroenterology   MM DIGITAL SCREENING BILATERAL   Pelvic pain       Relevant Medications   norethindrone (MICRONOR) 0.35 MG tablet   Screen for colon cancer       Relevant Orders   Ambulatory referral to Gastroenterology   Encounter for screening for malignant neoplasm of breast, unspecified screening modality       Relevant Orders   MM DIGITAL SCREENING BILATERAL      1) Mammogram - recommend yearly screening mammogram.  Mammogram Was ordered today  2) STI screening  was offered and declined  3) ASCCP guidelines and rationale discussed.  Patient opts for every 3 years screening interval  4) Contraception - the patient is currently using  tubal ligation.  She is happy with her current form of contraception and plans to continue  5) Colonoscopy: Referral sent --  Screening recommended starting at age 67 for average risk individuals, age 62 for individuals deemed at increased risk (including African Americans) and recommended to continue until age 3.  For patient age 66-85 individualized approach is recommended.  Gold standard screening is via colonoscopy, Cologuard screening is an acceptable alternative for patient unwilling or unable to undergo colonoscopy.  "Colorectal cancer screening for average?risk adults: 2018 guideline update from the American Cancer Society"CA: A Cancer Journal for Clinicians: Sep 17, 2016   6) Routine healthcare maintenance including cholesterol, diabetes screening discussed managed by PCP  7) Return in about 1 year (around 08/24/2020) for annual established gyn.   Rod Can, [facility] 08/25/2019, 10:09 AM

## 2019-09-01 ENCOUNTER — Telehealth (INDEPENDENT_AMBULATORY_CARE_PROVIDER_SITE_OTHER): Payer: Self-pay | Admitting: Gastroenterology

## 2019-09-01 DIAGNOSIS — Z800 Family history of malignant neoplasm of digestive organs: Secondary | ICD-10-CM

## 2019-09-01 DIAGNOSIS — Z1211 Encounter for screening for malignant neoplasm of colon: Secondary | ICD-10-CM

## 2019-09-01 NOTE — Progress Notes (Signed)
Gastroenterology Pre-Procedure Review  Request Date: 10/06/19 Requesting Physician: Dr. Maximino Greenland  PATIENT REVIEW QUESTIONS: The patient responded to the following health history questions as indicated:    1. Are you having any GI issues? no 2. Do you have a personal history of Polyps? no 3. Do you have a family history of Colon Cancer or Polyps? yes (mother colon cancer) 4. Diabetes Mellitus? yes 5. Joint replacements in the past 12 months?no 6. Major health problems in the past 3 months?no 7. Any artificial heart valves, MVP, or defibrillator?no    MEDICATIONS & ALLERGIES:    Patient reports the following regarding taking any anticoagulation/antiplatelet therapy:   Plavix, Coumadin, Eliquis, Xarelto, Lovenox, Pradaxa, Brilinta, or Effient? no Aspirin? no  Patient confirms/reports the following medications:  Current Outpatient Medications  Medication Sig Dispense Refill  . Acetaminophen-Caff-Pyrilamine (MIDOL COMPLETE) 500-60-15 MG TABS Take 2 tablets by mouth every 6 (six) hours.    . norethindrone (MICRONOR) 0.35 MG tablet Take 1 tablet (0.35 mg total) by mouth daily. 3 Package 4  . TOUJEO SOLOSTAR 300 UNIT/ML Solostar Pen Inject 40 Units into the skin in the morning.    Marland Kitchen TRIJARDY XR 12.5-2.08-998 MG TB24 Take 1 tablet by mouth 2 (two) times daily.    . Vitamin D, Ergocalciferol, (DRISDOL) 50000 units CAPS capsule Take 50,000 Units by mouth once a week.  2  . ONETOUCH VERIO test strip Apply 1 strip topically daily.  0   No current facility-administered medications for this visit.    Patient confirms/reports the following allergies:  Allergies  Allergen Reactions  . Penicillins Other (See Comments)    Unknown, has been told since she was a child  . Sulfa Antibiotics     No orders of the defined types were placed in this encounter.   AUTHORIZATION INFORMATION Primary Insurance: 1D#: Group #:  Secondary Insurance: 1D#: Group #:  SCHEDULE INFORMATION: Date:  10/06/19 Time: Location:[facility]

## 2019-10-03 ENCOUNTER — Telehealth: Payer: Self-pay

## 2019-10-03 NOTE — Telephone Encounter (Signed)
Pt states her colonoscopy has been canceled due to insurance not covering it can you order her cologuard.

## 2019-10-03 NOTE — Telephone Encounter (Signed)
Colonoscopy canceled for 10/06/19 due to insurance not covering.  Thanks,  [place], [place]

## 2019-10-04 ENCOUNTER — Other Ambulatory Visit: Admission: RE | Admit: 2019-10-04 | Payer: BC Managed Care – PPO | Source: Ambulatory Visit

## 2019-10-06 ENCOUNTER — Encounter: Admission: RE | Payer: Self-pay | Source: Home / Self Care

## 2019-10-06 ENCOUNTER — Ambulatory Visit
Admission: RE | Admit: 2019-10-06 | Payer: BC Managed Care – PPO | Source: Home / Self Care | Admitting: Gastroenterology

## 2019-10-06 SURGERY — COLONOSCOPY WITH PROPOFOL
Anesthesia: General

## 2019-10-10 NOTE — Telephone Encounter (Signed)
Pt insurance states she is covered she just has not met her deductible, if its coded at a preventative its covered 100%. Pt states she was going to be charged a 600 dollar co-pay, This is because she has not met the deductible., I advised her that you could not order the colorguard because she has a family history..the patient aware I would let you know and you would let her know the next steps if any

## 2019-10-10 NOTE — Telephone Encounter (Signed)
I did code it as a preventive procedure. I don't know what else I need to do to make this happen. Who else should we get involvedMickel Fuchs? Harriett Sine?

## 2019-10-11 NOTE — Telephone Encounter (Signed)
No she just has not reached her deductible, so she has a co-pay, its just not free, Im guessing, If she wants the procedure done she will have to pay the copay

## 2020-02-12 IMAGING — MG MM DIGITAL SCREENING BILAT W/ CAD
5 series · 5 of 5 positions shown · non-contrast
Comparison: Previous exam(s).

CLINICAL DATA: Screening.

EXAM:
DIGITAL SCREENING BILATERAL MAMMOGRAM WITH CAD

[L MLO (1 of 2)]
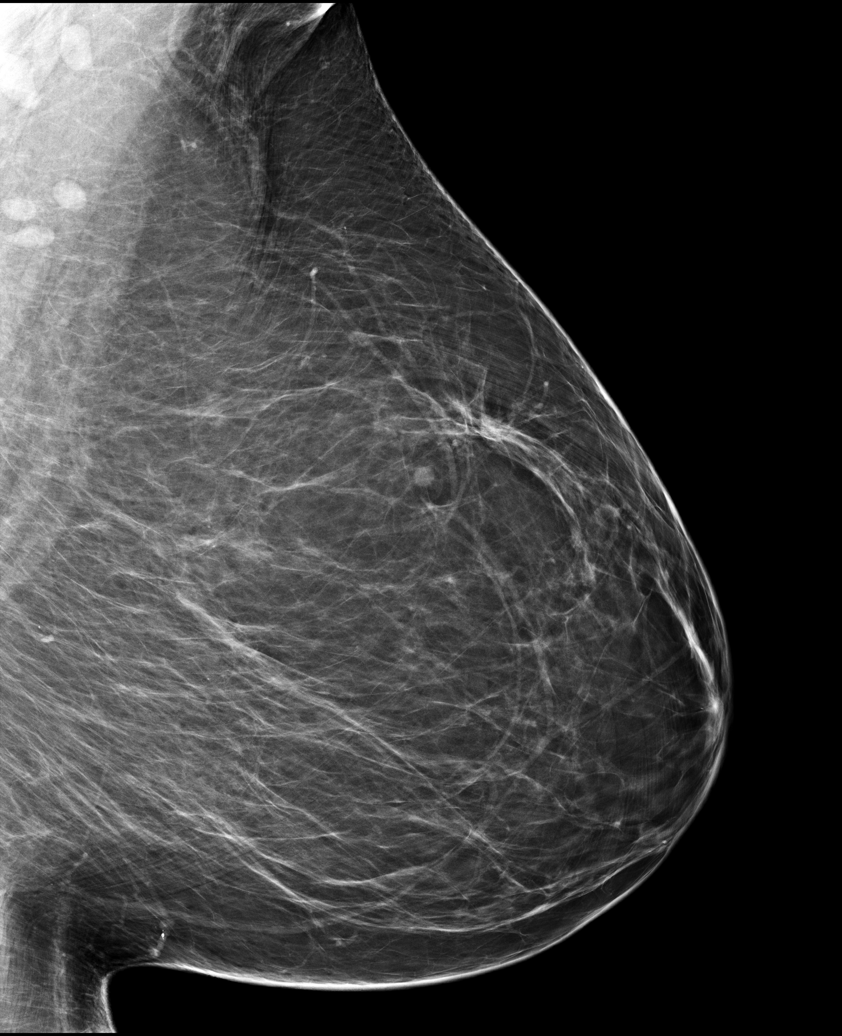

[L CC]
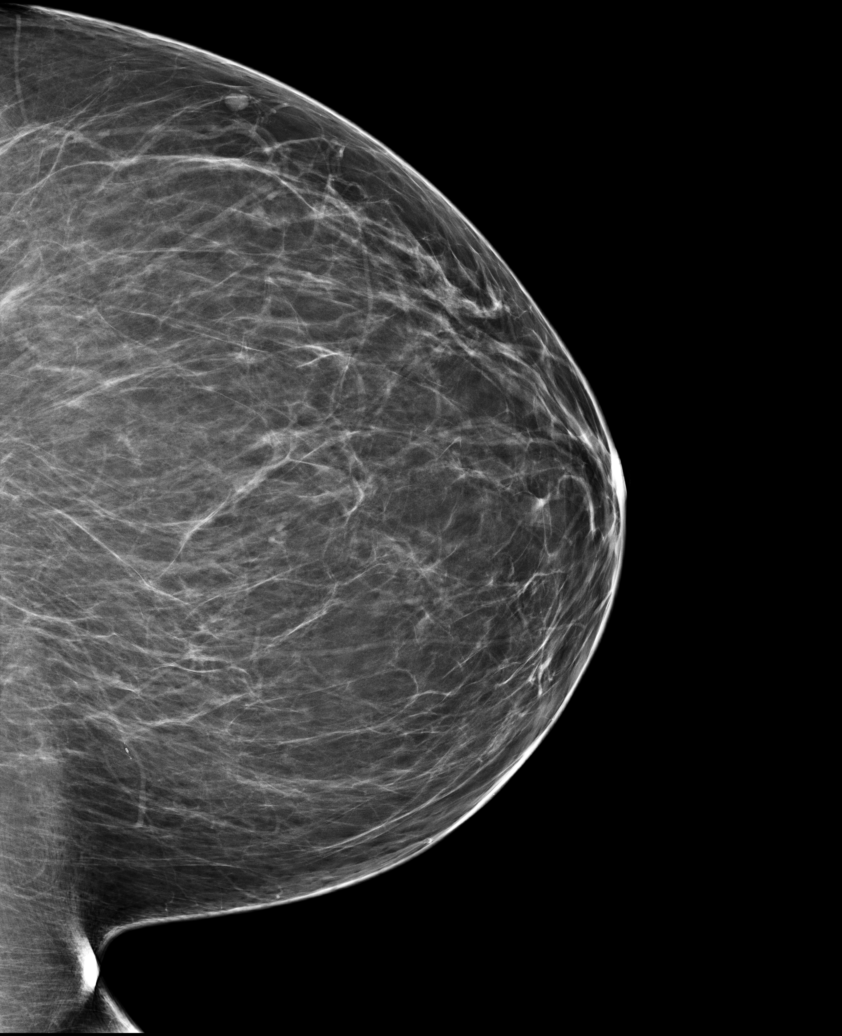

[R CC]
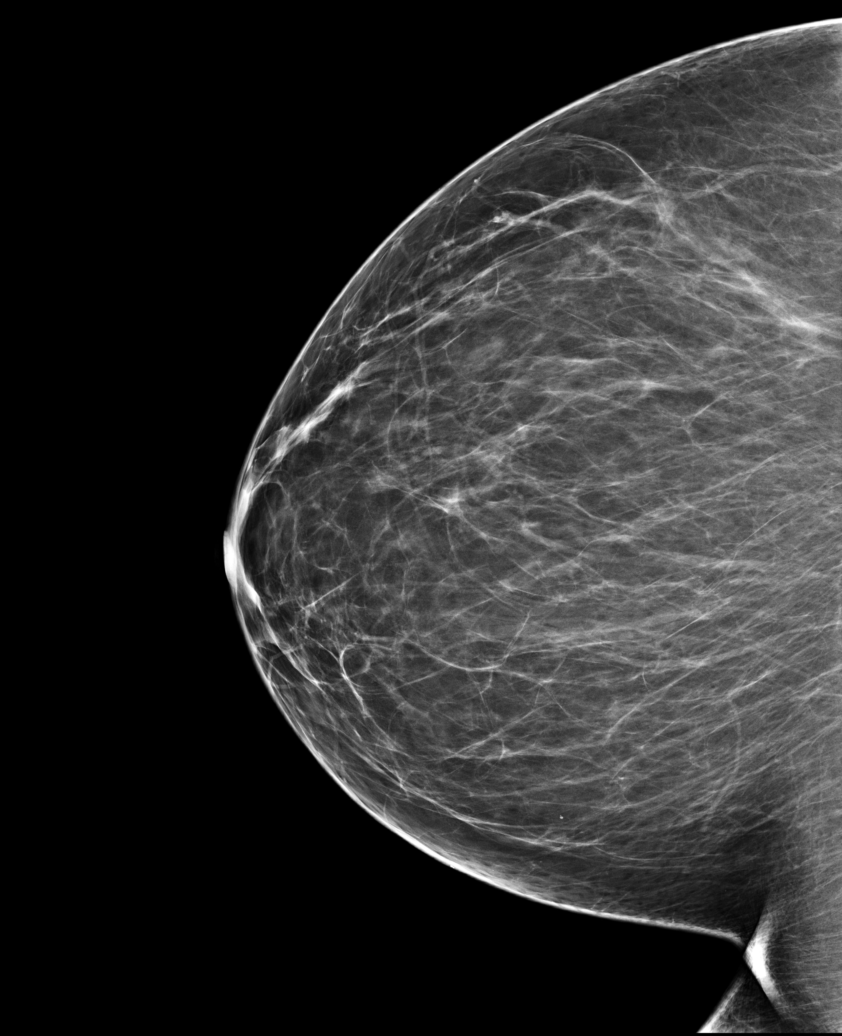

[L MLO (2 of 2)]
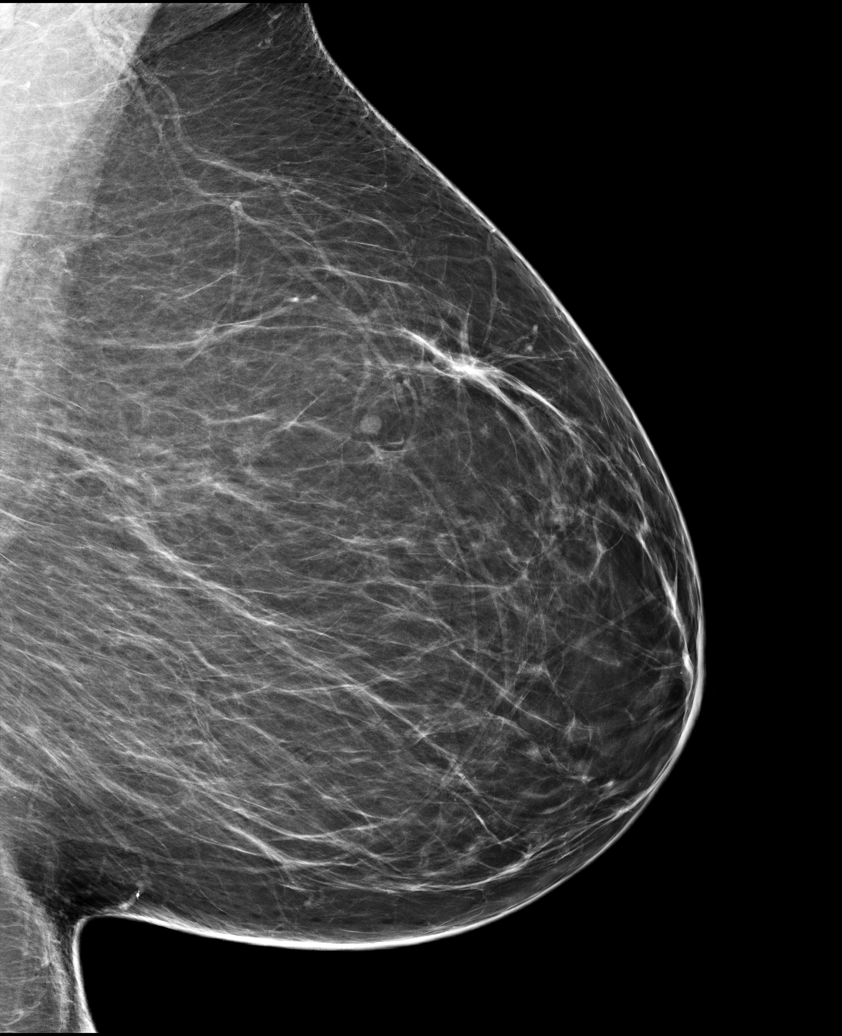

[R MLO]
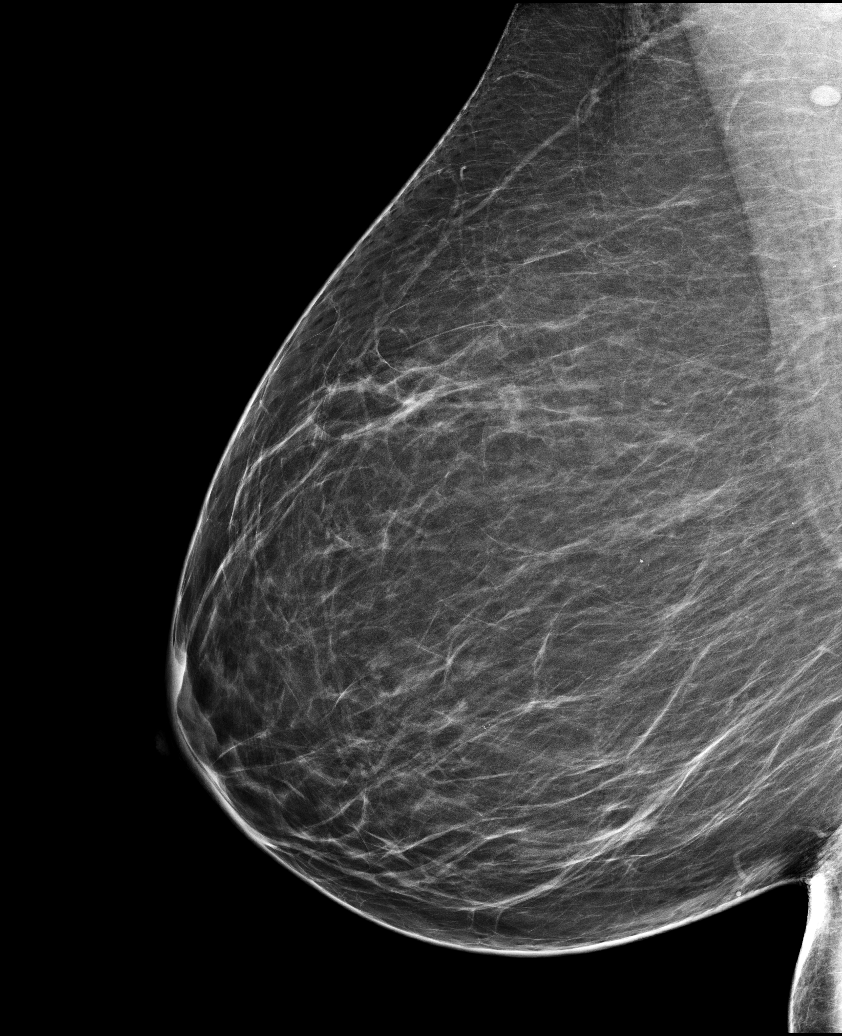

[5 of 5 positions shown; findings below may reference images not displayed]

ACR Breast Density Category b: There are scattered areas of
fibroglandular density.
FINDINGS: There are no findings suspicious for malignancy. Images were
processed with CAD.
IMPRESSION: No mammographic evidence of malignancy. A result letter of this
screening mammogram will be mailed directly to the patient.

RECOMMENDATION:
Screening mammogram in one year. (Code:AS-G-LCT)

BI-RADS CATEGORY  1: Negative.

## 2020-03-29 ENCOUNTER — Other Ambulatory Visit: Payer: Self-pay | Admitting: Endocrinology

## 2020-03-29 DIAGNOSIS — Z1231 Encounter for screening mammogram for malignant neoplasm of breast: Secondary | ICD-10-CM

## 2020-04-10 ENCOUNTER — Ambulatory Visit
Admission: RE | Admit: 2020-04-10 | Discharge: 2020-04-10 | Disposition: A | Discharge status: Home / Self Care | Payer: BC Managed Care – PPO | Source: Ambulatory Visit | Attending: Endocrinology | Admitting: Endocrinology

## 2020-04-10 ENCOUNTER — Other Ambulatory Visit: Payer: Self-pay

## 2020-04-10 DIAGNOSIS — Z1231 Encounter for screening mammogram for malignant neoplasm of breast: Secondary | ICD-10-CM

## 2020-04-11 DIAGNOSIS — Z1231 Encounter for screening mammogram for malignant neoplasm of breast: Secondary | ICD-10-CM

## 2020-04-25 ENCOUNTER — Other Ambulatory Visit
Admission: RE | Admit: 2020-04-25 | Discharge: 2020-04-25 | Disposition: A | Discharge status: Home / Self Care | Payer: BC Managed Care – PPO | Source: Ambulatory Visit | Attending: Gastroenterology | Admitting: Gastroenterology

## 2020-04-25 DIAGNOSIS — Z1211 Encounter for screening for malignant neoplasm of colon: Secondary | ICD-10-CM | POA: Diagnosis not present

## 2020-04-25 DIAGNOSIS — Z880 Allergy status to penicillin: Secondary | ICD-10-CM | POA: Diagnosis not present

## 2020-04-25 DIAGNOSIS — K635 Polyp of colon: Secondary | ICD-10-CM | POA: Diagnosis not present

## 2020-04-25 DIAGNOSIS — Z01812 Encounter for preprocedural laboratory examination: Secondary | ICD-10-CM | POA: Insufficient documentation

## 2020-04-25 DIAGNOSIS — Z20822 Contact with and (suspected) exposure to covid-19: Secondary | ICD-10-CM | POA: Insufficient documentation

## 2020-04-25 DIAGNOSIS — Z794 Long term (current) use of insulin: Secondary | ICD-10-CM | POA: Diagnosis not present

## 2020-04-25 DIAGNOSIS — Z882 Allergy status to sulfonamides status: Secondary | ICD-10-CM | POA: Diagnosis not present

## 2020-04-25 DIAGNOSIS — Z800 Family history of malignant neoplasm of digestive organs: Secondary | ICD-10-CM | POA: Diagnosis present

## 2020-04-25 DIAGNOSIS — Z79899 Other long term (current) drug therapy: Secondary | ICD-10-CM | POA: Diagnosis not present

## 2020-04-25 DIAGNOSIS — Z793 Long term (current) use of hormonal contraceptives: Secondary | ICD-10-CM | POA: Diagnosis not present

## 2020-04-25 DIAGNOSIS — K640 First degree hemorrhoids: Secondary | ICD-10-CM | POA: Diagnosis not present

## 2020-04-25 DIAGNOSIS — E119 Type 2 diabetes mellitus without complications: Secondary | ICD-10-CM | POA: Diagnosis not present

## 2020-04-25 LAB — SARS CORONAVIRUS 2 (TAT 6-24 HRS): SARS Coronavirus 2: NEGATIVE

## 2020-04-26 ENCOUNTER — Encounter: Payer: Self-pay | Admitting: *Deleted

## 2020-04-27 ENCOUNTER — Ambulatory Visit
Admission: RE | Admit: 2020-04-27 | Discharge: 2020-04-27 | Disposition: A | Discharge status: Home / Self Care | Payer: BC Managed Care – PPO | Attending: Gastroenterology | Admitting: Gastroenterology

## 2020-04-27 ENCOUNTER — Ambulatory Visit: Payer: BC Managed Care – PPO | Admitting: Certified Registered Nurse Anesthetist

## 2020-04-27 ENCOUNTER — Encounter
Admission: RE | Disposition: A | Discharge status: Home / Self Care | Payer: Self-pay | Source: Home / Self Care | Attending: Gastroenterology

## 2020-04-27 ENCOUNTER — Encounter: Payer: Self-pay | Admitting: *Deleted

## 2020-04-27 ENCOUNTER — Other Ambulatory Visit: Payer: Self-pay

## 2020-04-27 DIAGNOSIS — K635 Polyp of colon: Secondary | ICD-10-CM | POA: Insufficient documentation

## 2020-04-27 DIAGNOSIS — Z1211 Encounter for screening for malignant neoplasm of colon: Secondary | ICD-10-CM | POA: Insufficient documentation

## 2020-04-27 DIAGNOSIS — Z800 Family history of malignant neoplasm of digestive organs: Secondary | ICD-10-CM | POA: Insufficient documentation

## 2020-04-27 DIAGNOSIS — K640 First degree hemorrhoids: Secondary | ICD-10-CM | POA: Insufficient documentation

## 2020-04-27 DIAGNOSIS — Z79899 Other long term (current) drug therapy: Secondary | ICD-10-CM | POA: Insufficient documentation

## 2020-04-27 DIAGNOSIS — Z793 Long term (current) use of hormonal contraceptives: Secondary | ICD-10-CM | POA: Insufficient documentation

## 2020-04-27 DIAGNOSIS — Z794 Long term (current) use of insulin: Secondary | ICD-10-CM | POA: Insufficient documentation

## 2020-04-27 DIAGNOSIS — Z880 Allergy status to penicillin: Secondary | ICD-10-CM | POA: Insufficient documentation

## 2020-04-27 DIAGNOSIS — E119 Type 2 diabetes mellitus without complications: Secondary | ICD-10-CM | POA: Insufficient documentation

## 2020-04-27 DIAGNOSIS — Z882 Allergy status to sulfonamides status: Secondary | ICD-10-CM | POA: Insufficient documentation

## 2020-04-27 DIAGNOSIS — Z20822 Contact with and (suspected) exposure to covid-19: Secondary | ICD-10-CM | POA: Insufficient documentation

## 2020-04-27 HISTORY — PX: COLONOSCOPY WITH PROPOFOL: SHX5780

## 2020-04-27 HISTORY — DX: Essential (primary) hypertension: I10

## 2020-04-27 LAB — GLUCOSE, CAPILLARY: Glucose-Capillary: 126 mg/dL — ABNORMAL HIGH (ref 70–99)

## 2020-04-27 LAB — POCT PREGNANCY, URINE: Preg Test, Ur: NEGATIVE

## 2020-04-27 SURGERY — COLONOSCOPY WITH PROPOFOL
Anesthesia: General

## 2020-04-27 MED ORDER — PROPOFOL 500 MG/50ML IV EMUL
INTRAVENOUS | Status: AC
  Filled 2020-04-27: qty 50

## 2020-04-27 MED ORDER — PROPOFOL 10 MG/ML IV BOLUS
INTRAVENOUS | Status: DC | PRN
  Administered 2020-04-27: 40 mg via INTRAVENOUS

## 2020-04-27 MED ORDER — PROPOFOL 500 MG/50ML IV EMUL
INTRAVENOUS | Status: DC | PRN
  Administered 2020-04-27: 125 ug/kg/min via INTRAVENOUS

## 2020-04-27 MED ORDER — LIDOCAINE HCL (CARDIAC) PF 100 MG/5ML IV SOSY
PREFILLED_SYRINGE | INTRAVENOUS | Status: DC | PRN
  Administered 2020-04-27: 50 mg via INTRAVENOUS

## 2020-04-27 MED ORDER — PROPOFOL 10 MG/ML IV BOLUS
INTRAVENOUS | Status: AC
  Filled 2020-04-27: qty 20

## 2020-04-27 MED ORDER — SODIUM CHLORIDE 0.9 % IV SOLN: INTRAVENOUS | Status: DC

## 2020-04-27 NOTE — Transfer of Care (Signed)
Immediate Anesthesia Transfer of Care Note  Patient: KERRIGAN GOMBOS  Procedure(s) Performed: COLONOSCOPY WITH PROPOFOL (N/A )  Patient Location: PACU  Anesthesia Type:General  Level of Consciousness: awake, alert  and oriented  Airway & Oxygen Therapy: Patient Spontanous Breathing and Patient connected to nasal cannula oxygen  Post-op Assessment: Report given to RN and Post -op Vital signs reviewed and stable  Post vital signs: Reviewed and stable  Last Vitals:  Vitals Value Taken Time  BP    Temp    Pulse 86 04/27/20 1339  Resp    SpO2 100 % 04/27/20 1339  Vitals shown include unvalidated device data.  Last Pain:  Vitals:   04/27/20 1303  TempSrc: Temporal  PainSc: 0-No pain         Complications: No complications documented.

## 2020-04-27 NOTE — H&P (Signed)
Outpatient short stay form Pre-procedure 04/27/2020 1:13 PM Raylene Miyamoto MD, MPH  Primary Physician: Dr. Rhona Leavens  Reason for visit:  Screening  History of present illness:   50 y/o lady with history of DM II and family history of colon cancer in her mother in her 46's here for screening colonoscopy. No blood thinners or abdominal surgeries. No new GI symptoms.    Current Facility-Administered Medications:  .  0.9 %  sodium chloride infusion, , Intravenous, Continuous, Sol Englert, Hilton Cork, MD, Last Rate: 20 mL/hr at 04/27/20 1309, New Bag at 04/27/20 1309  Medications Prior to Admission  Medication Sig Dispense Refill Last Dose  . norethindrone (MICRONOR) 0.35 MG tablet Take 1 tablet (0.35 mg total) by mouth daily. 3 Package 4 Past Week at Unknown time  . TOUJEO SOLOSTAR 300 UNIT/ML Solostar Pen Inject 40 Units into the skin in the morning.   Past Week at Unknown time  . Acetaminophen-Caff-Pyrilamine (MIDOL COMPLETE) 500-60-15 MG TABS Take 2 tablets by mouth every 6 (six) hours.     . insulin aspart (NOVOLOG) 100 UNIT/ML injection Inject into the skin 3 (three) times daily before meals. As Needed Before Meals     . ONETOUCH VERIO test strip Apply 1 strip topically daily.  0   . TRIJARDY XR 12.5-2.08-998 MG TB24 Take 1 tablet by mouth 2 (two) times daily.   04/25/2020  . Vitamin D, Ergocalciferol, (DRISDOL) 50000 units CAPS capsule Take 50,000 Units by mouth once a week.  2 04/23/2020     Allergies  Allergen Reactions  . Penicillins Other (See Comments)    Unknown, has been told since she was a child  . Sulfa Antibiotics      Past Medical History:  Diagnosis Date  . BRCA negative    updated genetic testing letter sent 2/20  . Diabetes mellitus without complication ([place]) [id number]/[id number]  . Family history of colon cancer    COLARIS NOT DONE D/T NOT COVERED IN 2014 D/T FAM HX  . Family history of ovarian cancer   . History of mammogram 09/23/2011; 10/31/13   AT [facility]; BENIGN  . History of  Papanicolaou smear of cervix 2006; 10/31/13   -/-; -/-  . Vitamin D deficiency 09/2011    Review of systems:  Otherwise negative.    Physical Exam  Gen: Alert, oriented. Appears stated age.  HEENT: PERRLA. Lungs: No respiratory distress CV: RRR Abd: soft, benign, no masses Ext: No edema    Planned procedures: Proceed with colonoscopy. The patient understands the nature of the planned procedure, indications, risks, alternatives and potential complications including but not limited to bleeding, infection, perforation, damage to internal organs and possible oversedation/side effects from anesthesia. The patient agrees and gives consent to proceed.  Please refer to procedure notes for findings, recommendations and patient disposition/instructions.     Raylene Miyamoto MD, MPH Gastroenterology 04/27/2020  1:13 PM

## 2020-04-27 NOTE — Interval H&P Note (Signed)
History and Physical Interval Note:  04/27/2020 1:21 PM  Alice Rodriguez  has presented today for surgery, with the diagnosis of FAM HX CCA.  The various methods of treatment have been discussed with the patient and family. After consideration of risks, benefits and other options for treatment, the patient has consented to  Procedure(s): COLONOSCOPY WITH PROPOFOL (N/A) as a surgical intervention.  The patient's history has been reviewed, patient examined, no change in status, stable for surgery.  I have reviewed the patient's chart and labs.  Questions were answered to the patient's satisfaction.     Regis Bill  Ok to proceed with colonoscopy.

## 2020-04-27 NOTE — Op Note (Signed)
[facility] Gastroenterology Patient Name: Alice Rodriguez Procedure Date: 04/27/2020 12:23 PM MRN: [id number] Account #: [id number] Date of Birth: 06/23/1970 Admit Type: Outpatient Age: 50 Room: [facility] ENDO ROOM 3 Gender: Female Note Status: Finalized Procedure:             Colonoscopy Indications:           Screening in patient at increased risk: Family history                         of 1st-degree relative with colorectal cancer before                         age 59 years Providers:             Andrey Farmer MD, MD Medicines:             Monitored Anesthesia Care Complications:         No immediate complications. Estimated blood loss:                         Minimal. Procedure:             Pre-Anesthesia Assessment:                        - Prior to the procedure, a History and Physical was                         performed, and patient medications and allergies were                         reviewed. The patient is competent. The risks and                         benefits of the procedure and the sedation options and                         risks were discussed with the patient. All questions                         were answered and informed consent was obtained.                         Patient identification and proposed procedure were                         verified by the physician, the nurse, the anesthetist                         and the technician in the endoscopy suite. Mental                         Status Examination: alert and oriented. Airway                         Examination: normal oropharyngeal airway and neck                         mobility. Respiratory Examination: clear to  auscultation. CV Examination: normal. Prophylactic                         Antibiotics: The patient does not require prophylactic                         antibiotics. Prior Anticoagulants: The patient has                         taken no previous  anticoagulant or antiplatelet                         agents. ASA Grade Assessment: II - A patient with mild                         systemic disease. After reviewing the risks and                         benefits, the patient was deemed in satisfactory                         condition to undergo the procedure. The anesthesia                         plan was to use monitored anesthesia care (MAC).                         Immediately prior to administration of medications,                         the patient was re-assessed for adequacy to receive                         sedatives. The heart rate, respiratory rate, oxygen                         saturations, blood pressure, adequacy of pulmonary                         ventilation, and response to care were monitored                         throughout the procedure. The physical status of the                         patient was re-assessed after the procedure.                        After obtaining informed consent, the colonoscope was                         passed under direct vision. Throughout the procedure,                         the patient's blood pressure, pulse, and oxygen                         saturations were monitored continuously. The  Colonoscope was introduced through the anus and                         advanced to the the cecum, identified by appendiceal                         orifice and ileocecal valve. The colonoscopy was                         performed without difficulty. The patient tolerated                         the procedure well. The quality of the bowel                         preparation was good. Findings:      The perianal and digital rectal examinations were normal.      A 2 mm polyp was found in the recto-sigmoid colon. The polyp was       sessile. The polyp was removed with a cold snare. Resection and       retrieval were complete. Estimated blood loss was minimal.       Internal hemorrhoids were found during retroflexion. The hemorrhoids       were Grade I (internal hemorrhoids that do not prolapse).      The exam was otherwise without abnormality on direct and retroflexion       views. Impression:            - One 2 mm polyp at the recto-sigmoid colon, removed                         with a cold snare. Resected and retrieved.                        - Internal hemorrhoids.                        - The examination was otherwise normal on direct and                         retroflexion views. Recommendation:        - Discharge patient to home.                        - Resume previous diet.                        - Continue present medications.                        - Await pathology results.                        - Repeat colonoscopy in 5 years for surveillance.                        - Return to referring physician as previously                         scheduled. Procedure Code(s):     --- Professional ---  45385, Colonoscopy, flexible; with removal of                         tumor(s), polyp(s), or other lesion(s) by snare                         technique Diagnosis Code(s):     --- Professional ---                        Z80.0, Family history of malignant neoplasm of                         digestive organs                        K63.5, Polyp of colon                        K64.0, First degree hemorrhoids CPT copyright 2019 American Medical Association. All rights reserved. The codes documented in this report are preliminary and upon coder review may  be revised to meet current compliance requirements. Andrey Farmer MD, MD 04/27/2020 1:39:09 PM Number of Addenda: 0 Note Initiated On: 04/27/2020 12:23 PM Scope Withdrawal Time: 0 hours 6 minutes 52 seconds  Total Procedure Duration: 0 hours 11 minutes 45 seconds  Estimated Blood Loss:  Estimated blood loss was minimal.      [facility]

## 2020-04-27 NOTE — Anesthesia Preprocedure Evaluation (Signed)
Anesthesia Evaluation  Patient identified by MRN, date of birth, ID band Patient awake    Reviewed: Allergy & Precautions, H&P , NPO status , Patient's Chart, lab work & pertinent test results, reviewed documented beta blocker date and time   History of Anesthesia Complications Negative for: history of anesthetic complications  Airway Mallampati: II  TM Distance: >3 FB Neck ROM: full    Dental  (+) Dental Advidsory Given, Teeth Intact Permanent bridge upper left:   Pulmonary neg pulmonary ROS,    Pulmonary exam normal breath sounds clear to auscultation       Cardiovascular Exercise Tolerance: Good negative cardio ROS Normal cardiovascular exam Rhythm:regular Rate:Normal     Neuro/Psych negative neurological ROS  negative psych ROS   GI/Hepatic negative GI ROS, Neg liver ROS,   Endo/Other  diabetes  Renal/GU negative Renal ROS  negative genitourinary   Musculoskeletal   Abdominal   Peds  Hematology negative hematology ROS (+)   Anesthesia Other Findings Past Medical History: No date: BRCA negative     Comment:  updated genetic testing letter sent 2/20 09/2011: Diabetes mellitus without complication ([place]) No date: Family history of colon cancer     Comment:  COLARIS NOT DONE D/T NOT COVERED IN 2014 D/T FAM HX No date: Family history of ovarian cancer 09/23/2011; 10/31/13: History of mammogram     Comment:  AT [facility]; BENIGN 2006; 10/31/13: History of Papanicolaou smear of cervix     Comment:  -/-; -/- 09/2011: Vitamin D deficiency   Reproductive/Obstetrics negative OB ROS                             Anesthesia Physical Anesthesia Plan  ASA: II  Anesthesia Plan: General   Post-op Pain Management:    Induction: Intravenous  PONV Risk Score and Plan: 3 and TIVA and Propofol infusion  Airway Management Planned: Natural Airway and Nasal Cannula  Additional Equipment:   Intra-op  Plan:   Post-operative Plan:   Informed Consent: I have reviewed the patients History and Physical, chart, labs and discussed the procedure including the risks, benefits and alternatives for the proposed anesthesia with the patient or authorized representative who has indicated his/her understanding and acceptance.     Dental Advisory Given  Plan Discussed with: Anesthesiologist, CRNA and Surgeon  Anesthesia Plan Comments:         Anesthesia Quick Evaluation

## 2020-04-28 NOTE — Anesthesia Postprocedure Evaluation (Signed)
Anesthesia Post Note  Patient: RUSSIA SCHEIDERER  Procedure(s) Performed: COLONOSCOPY WITH PROPOFOL (N/A )  Patient location during evaluation: Endoscopy Anesthesia Type: General Level of consciousness: awake and alert Pain management: pain level controlled Vital Signs Assessment: post-procedure vital signs reviewed and stable Respiratory status: spontaneous breathing, nonlabored ventilation, respiratory function stable and patient connected to nasal cannula oxygen Cardiovascular status: blood pressure returned to baseline and stable Postop Assessment: no apparent nausea or vomiting Anesthetic complications: no   No complications documented.   Last Vitals:  Vitals:   04/27/20 1349 04/27/20 1409  BP:  125/75  Pulse:    Resp: 16   Temp:    SpO2:      Last Pain:  Vitals:   04/28/20 1347  TempSrc:   PainSc: 0-No pain                 Lenard Simmer

## 2020-04-30 LAB — SURGICAL PATHOLOGY

## 2020-10-01 ENCOUNTER — Other Ambulatory Visit: Payer: Self-pay | Admitting: Advanced Practice Midwife

## 2020-10-01 DIAGNOSIS — R102 Pelvic and perineal pain: Secondary | ICD-10-CM

## 2021-01-24 ENCOUNTER — Other Ambulatory Visit: Payer: Self-pay

## 2021-01-24 ENCOUNTER — Other Ambulatory Visit (HOSPITAL_COMMUNITY)
Admission: RE | Admit: 2021-01-24 | Discharge: 2021-01-24 | Disposition: A | Discharge status: Home / Self Care | Payer: BC Managed Care – PPO | Source: Ambulatory Visit | Attending: Advanced Practice Midwife | Admitting: Advanced Practice Midwife

## 2021-01-24 ENCOUNTER — Ambulatory Visit (INDEPENDENT_AMBULATORY_CARE_PROVIDER_SITE_OTHER): Payer: BC Managed Care – PPO | Admitting: Advanced Practice Midwife

## 2021-01-24 ENCOUNTER — Encounter: Payer: Self-pay | Admitting: Advanced Practice Midwife

## 2021-01-24 VITALS — BP 100/60 | Ht 66.0 in | Wt 197.0 lb

## 2021-01-24 DIAGNOSIS — Z01419 Encounter for gynecological examination (general) (routine) without abnormal findings: Secondary | ICD-10-CM | POA: Diagnosis not present

## 2021-01-24 DIAGNOSIS — R102 Pelvic and perineal pain unspecified side: Secondary | ICD-10-CM

## 2021-01-24 DIAGNOSIS — Z1239 Encounter for other screening for malignant neoplasm of breast: Secondary | ICD-10-CM

## 2021-01-24 DIAGNOSIS — Z124 Encounter for screening for malignant neoplasm of cervix: Secondary | ICD-10-CM | POA: Insufficient documentation

## 2021-01-24 MED ORDER — NORETHINDRONE 0.35 MG PO TABS: 1.0000 | ORAL_TABLET | Freq: Every day | ORAL | 3 refills | Status: DC

## 2021-01-25 ENCOUNTER — Encounter: Payer: Self-pay | Admitting: Advanced Practice Midwife

## 2021-01-25 NOTE — Progress Notes (Addendum)
Gynecology Annual Exam  Date of Service: 01/24/2021  PCP: Lenard Simmer, MD  Chief Complaint:  Chief Complaint  Patient presents with   Annual Exam    History of Present Illness: Patient is a 50 y.o. G3P3003 presents for annual exam. The patient has no gyn complaints today. She does want to continue on POP for cramping control  LMP: No LMP recorded. (Menstrual status: Oral contraceptives).  Postcoital Bleeding: no Dysmenorrhea: no   The patient is sexually active. She currently uses tubal ligation for contraception. She denies dyspareunia.  The patient does perform self breast exams.  There is notable family history of breast or ovarian cancer in her family. She is BRCA negative.  The patient wears seatbelts: yes.   The patient has regular exercise:  she walks regularly, she admits healthy diet, adequate hydration and adequate sleep .    The patient denies current symptoms of depression.    Review of Systems: Review of Systems  Constitutional:  Negative for chills and fever.  HENT:  Negative for congestion, ear discharge, ear pain, hearing loss, sinus pain and sore throat.   Eyes:  Negative for blurred vision and double vision.  Respiratory:  Negative for cough, shortness of breath and wheezing.   Cardiovascular:  Negative for chest pain, palpitations and leg swelling.  Gastrointestinal:  Negative for abdominal pain, blood in stool, constipation, diarrhea, heartburn, melena, nausea and vomiting.  Genitourinary:  Negative for dysuria, flank pain, frequency, hematuria and urgency.  Musculoskeletal:  Negative for back pain, joint pain and myalgias.  Skin:  Negative for itching and rash.  Neurological:  Negative for dizziness, tingling, tremors, sensory change, speech change, focal weakness, seizures, loss of consciousness, weakness and headaches.  Endo/Heme/Allergies:  Negative for environmental allergies. Does not bruise/bleed easily.  Psychiatric/Behavioral:  Negative  for depression, hallucinations, memory loss, substance abuse and suicidal ideas. The patient is not nervous/anxious and does not have insomnia.    Past Medical History:  Patient Active Problem List   Diagnosis Date Noted   Plantar fasciitis of right foot 03/03/2019   Adenomyosis 05/21/2017    Past Surgical History:  Past Surgical History:  Procedure Laterality Date   COLONOSCOPY WITH PROPOFOL N/A 04/27/2020   Procedure: COLONOSCOPY WITH PROPOFOL;  Surgeon: Lesly Rubenstein, MD;  Location: [facility] ENDOSCOPY;  Service: Endoscopy;  Laterality: N/A;   INTRAUTERINE DEVICE (IUD) INSERTION  2004   MIRENA   NOVASURE ABLATION     TUBAL LIGATION     PP    Gynecologic History:  No LMP recorded. (Menstrual status: Oral contraceptives). Contraception: tubal ligation Last Pap: 2019 Results were:  no abnormalities  Last mammogram: December 2021 Results were: Gillian Shields I  Obstetric History: G3P3003  Family History:  Family History  Problem Relation Age of Onset   Cancer Mother 54       COLON   Diabetes Mother    Cancer Maternal Grandmother 30       COLON; OVARY-80   Breast cancer Maternal Grandmother    Breast cancer Paternal Grandmother 92   Breast cancer Paternal Aunt 74    Social History:  Social History   Socioeconomic History   Marital status: Married    Spouse name: Not on file   Number of children: 3   Years of education: 16   Highest education level: Not on file  Occupational History   Not on file  Tobacco Use   Smoking status: Never   Smokeless tobacco: Never  Vaping Use  Vaping Use: Never used  Substance and Sexual Activity   Alcohol use: Yes    Comment: rarely   Drug use: No   Sexual activity: Yes    Birth control/protection: Post-menopausal  Other Topics Concern   Not on file  Social History Narrative   Not on file   Social Determinants of Health   Financial Resource Strain: Not on file  Food Insecurity: Not on file  Transportation Needs: Not on file   Physical Activity: Not on file  Stress: Not on file  Social Connections: Not on file  Intimate Partner Violence: Not on file    Allergies:  Allergies  Allergen Reactions   Penicillins Other (See Comments)    Unknown, has been told since she was a child   Sulfa Antibiotics     Medications: Prior to Admission medications   Medication Sig Start Date End Date Taking? Authorizing Provider  insulin aspart (NOVOLOG) 100 UNIT/ML injection Inject into the skin 3 (three) times daily before meals. As Needed Before Meals   Yes [provider]  [facility] VERIO test strip Apply 1 strip topically daily. 03/18/17  Yes [provider]  TRIJARDY XR 12.5-2.08-998 MG TB24 Take 1 tablet by mouth 2 (two) times daily. 07/25/19  Yes [provider]  Vitamin D, Ergocalciferol, (DRISDOL) 50000 units CAPS capsule Take 50,000 Units by mouth once a week. 04/18/17  Yes [provider]  Acetaminophen-Caff-Pyrilamine 500-60-15 MG TABS Take 2 tablets by mouth every 6 (six) hours. Patient not taking: Reported on 01/24/2021    [provider]  norethindrone (MICRONOR) 0.35 MG tablet Take 1 tablet (0.35 mg total) by mouth daily. 01/24/21   Rod Can, CNM  TOUJEO SOLOSTAR 300 UNIT/ML Solostar Pen Inject 40 Units into the skin in the morning. Patient not taking: Reported on 01/24/2021 07/24/19   [provider]    Physical Exam Vitals: Blood pressure 100/60, height _0  (1.676 m), weight 197 lb (89.4 kg).  General: NAD HEENT: normocephalic, anicteric Thyroid: no enlargement, no palpable nodules Pulmonary: No increased work of breathing, CTAB Cardiovascular: RRR, distal pulses 2+ Breast: Breast symmetrical, no tenderness, no palpable nodules or masses, no skin or nipple retraction present, no nipple discharge.  No axillary or supraclavicular lymphadenopathy. Abdomen: NABS, soft, non-tender, non-distended.  Umbilicus without lesions.  No hepatomegaly, splenomegaly  or masses palpable. No evidence of hernia  Genitourinary:  External: Normal external female genitalia.  Normal urethral meatus, normal Bartholin's and Skene's glands.    Vagina: Normal vaginal mucosa, no evidence of prolapse.    Cervix: Grossly normal in appearance, no bleeding, no CMT  Uterus: Non-enlarged, mobile, normal contour.    Adnexa: ovaries non-enlarged, no adnexal masses  Rectal: deferred  Lymphatic: no evidence of inguinal lymphadenopathy Extremities: no edema, erythema, or tenderness Neurologic: Grossly intact Psychiatric: mood appropriate, affect full   Assessment: 50 y.o. G3P3003 routine annual exam  Plan: Problem List Items Addressed This Visit   None Visit Diagnoses     Well woman exam with routine gynecological exam    -  Primary   Relevant Orders   Cytology - PAP   MM 3D SCREEN BREAST BILATERAL   Screening for cervical cancer       Relevant Orders   Cytology - PAP   Breast screening       Relevant Orders   MM 3D SCREEN BREAST BILATERAL   Pelvic pain       Relevant Medications   norethindrone (MICRONOR) 0.35 MG tablet  1) Mammogram - recommend yearly screening mammogram.  Mammogram Was ordered today  2) STI screening  was offered and declined  3) ASCCP guidelines and rationale discussed.  Patient opts for every 3 years screening interval  4) Contraception - the patient is currently using  tubal ligation.  She is happy with her current form of contraception and plans to continue  5) Colonoscopy is up to date (January 2022)-- Screening recommended starting at age 56 for average risk individuals, age 97 for individuals deemed at increased risk (including African Americans) and recommended to continue until age 38.  For patient age 25-85 individualized approach is recommended.  Gold standard screening is via colonoscopy, Cologuard screening is an acceptable alternative for patient unwilling or unable to undergo colonoscopy.  "Colorectal cancer  screening for average?risk adults: 2018 guideline update from the [place]: A Cancer Journal for Clinicians: Sep 17, 2016   6) Routine healthcare maintenance including cholesterol, diabetes screening discussed managed by PCP  7) Return in about 1 year (around 01/24/2022) for annual established gyn.   Rod Can, [facility] 01/25/2021, 1:07 PM

## 2021-01-28 LAB — CYTOLOGY - PAP
Comment: NEGATIVE
Diagnosis: NEGATIVE
High risk HPV: NEGATIVE

## 2021-02-11 ENCOUNTER — Encounter: Payer: Self-pay | Admitting: Obstetrics and Gynecology

## 2021-04-11 ENCOUNTER — Ambulatory Visit
Admission: RE | Admit: 2021-04-11 | Discharge: 2021-04-11 | Disposition: A | Discharge status: Home / Self Care | Payer: BC Managed Care – PPO | Source: Ambulatory Visit | Attending: Advanced Practice Midwife | Admitting: Advanced Practice Midwife

## 2021-04-11 ENCOUNTER — Other Ambulatory Visit: Payer: Self-pay

## 2021-04-11 DIAGNOSIS — Z1239 Encounter for other screening for malignant neoplasm of breast: Secondary | ICD-10-CM

## 2021-04-11 DIAGNOSIS — Z01419 Encounter for gynecological examination (general) (routine) without abnormal findings: Secondary | ICD-10-CM

## 2021-07-02 ENCOUNTER — Other Ambulatory Visit (HOSPITAL_COMMUNITY): Payer: Self-pay | Admitting: Physical Medicine & Rehabilitation

## 2021-07-02 ENCOUNTER — Other Ambulatory Visit: Payer: Self-pay | Admitting: Physical Medicine & Rehabilitation

## 2021-07-02 DIAGNOSIS — M5440 Lumbago with sciatica, unspecified side: Secondary | ICD-10-CM

## 2021-07-02 DIAGNOSIS — M542 Cervicalgia: Secondary | ICD-10-CM

## 2021-07-11 ENCOUNTER — Ambulatory Visit
Admission: RE | Admit: 2021-07-11 | Discharge: 2021-07-11 | Disposition: A | Discharge status: Home / Self Care | Payer: BC Managed Care – PPO | Source: Ambulatory Visit | Attending: Physical Medicine & Rehabilitation | Admitting: Physical Medicine & Rehabilitation

## 2021-07-11 DIAGNOSIS — M5440 Lumbago with sciatica, unspecified side: Secondary | ICD-10-CM

## 2021-07-11 DIAGNOSIS — M542 Cervicalgia: Secondary | ICD-10-CM | POA: Insufficient documentation

## 2021-07-29 ENCOUNTER — Other Ambulatory Visit: Payer: Self-pay | Admitting: Sports Medicine

## 2021-08-07 ENCOUNTER — Ambulatory Visit
Admission: RE | Admit: 2021-08-07 | Discharge: 2021-08-07 | Disposition: A | Discharge status: Home / Self Care | Payer: BC Managed Care – PPO | Source: Ambulatory Visit | Attending: Sports Medicine | Admitting: Sports Medicine

## 2021-08-09 ENCOUNTER — Other Ambulatory Visit: Payer: BC Managed Care – PPO

## 2022-03-19 ENCOUNTER — Other Ambulatory Visit: Payer: Self-pay | Admitting: Advanced Practice Midwife

## 2022-03-19 DIAGNOSIS — R102 Pelvic and perineal pain: Secondary | ICD-10-CM

## 2022-03-20 ENCOUNTER — Other Ambulatory Visit: Payer: Self-pay | Admitting: Advanced Practice Midwife

## 2022-03-20 DIAGNOSIS — R102 Pelvic and perineal pain: Secondary | ICD-10-CM

## 2022-05-09 LAB — HEPATIC FUNCTION PANEL
ALT: 14 U/L (ref 7–35)
Alkaline Phosphatase: 53 (ref 25–125)
Bilirubin, Total: 0.4

## 2022-05-09 LAB — COMPREHENSIVE METABOLIC PANEL
Albumin: 4 (ref 3.5–5.0)
Calcium: 9 (ref 8.7–10.7)
Globulin: 2.5
eGFR: 106

## 2022-05-09 LAB — HEMOGLOBIN A1C: Hemoglobin A1C: 6.8

## 2022-05-09 LAB — BASIC METABOLIC PANEL
CO2: 24 — AB (ref 13–22)
Chloride: 103 (ref 99–108)
Creatinine: 0.7 (ref 0.5–1.1)
Glucose: 131
Sodium: 141 (ref 137–147)

## 2022-05-09 LAB — CBC AND DIFFERENTIAL
HCT: 41 (ref 36–46)
Hemoglobin: 13.8 (ref 12.0–16.0)
Neutrophils Absolute: 53
Platelets: 274 10*3/uL (ref 150–400)
WBC: 6.3

## 2022-05-09 LAB — LIPID PANEL
Cholesterol: 169 (ref 0–200)
HDL: 68 (ref 35–70)
LDL Cholesterol: 81
LDl/HDL Ratio: 1.2
Triglycerides: 111 (ref 40–160)

## 2022-05-09 LAB — CBC: RBC: 4.45 (ref 3.87–5.11)

## 2022-12-29 ENCOUNTER — Encounter: Payer: Self-pay | Admitting: Advanced Practice Midwife

## 2022-12-29 ENCOUNTER — Ambulatory Visit: Payer: 59 | Admitting: Advanced Practice Midwife

## 2022-12-29 VITALS — BP 136/78 | HR 64 | Ht 67.0 in | Wt 200.9 lb

## 2022-12-29 DIAGNOSIS — Z01419 Encounter for gynecological examination (general) (routine) without abnormal findings: Secondary | ICD-10-CM

## 2022-12-29 DIAGNOSIS — Z0000 Encounter for general adult medical examination without abnormal findings: Secondary | ICD-10-CM

## 2022-12-29 DIAGNOSIS — Z1231 Encounter for screening mammogram for malignant neoplasm of breast: Secondary | ICD-10-CM

## 2023-01-06 ENCOUNTER — Encounter: Payer: Self-pay | Admitting: Advanced Practice Midwife

## 2023-01-06 NOTE — Patient Instructions (Signed)
Pap Test Why am I having this test? A Pap test, also called a Pap smear, is a screening test to check for signs of: Infection. Cancer of the cervix. The cervix is the lower part of the uterus that opens into the vagina. Changes that may be a sign that cancer is developing (precancerous changes). Women need this test on a regular basis. In general, you should have a Pap test every 3 years until you reach menopause or age 51. Women aged 30-60 may choose to have their Pap test done at the same time as an HPV (human papillomavirus) test every 5 years (instead of every 3 years). Your health care provider may recommend having Pap tests more or less often depending on your medical conditions and past Pap test results. What is being tested? Cervical cells are tested for signs of infection or abnormalities. What kind of sample is taken?  Your health care provider will collect a sample of cells from the surface of your cervix. This will be done using a small cotton swab, plastic spatula, or brush that is inserted into your vagina using a tool called a speculum. This sample is often collected during a pelvic exam, when you are lying on your back on an exam table with your feet in footrests (stirrups). In some cases, fluids (secretions) from the cervix or vagina may also be collected. How do I prepare for this test? Be aware of where you are in your menstrual cycle. If you are menstruating on the day of the test, you may be asked to reschedule. You may need to reschedule if you have a known vaginal infection on the day of the test. Follow instructions from your health care provider about: Changing or stopping your regular medicines. Some medicines can cause abnormal test results, such as vaginal medicines and tetracycline. Avoiding douching 2-3 days before or the day of the test. Tell a health care provider about: Any allergies you have. All medicines you are taking, including vitamins, herbs, eye drops,  creams, and over-the-counter medicines. Any bleeding problems you have. Any surgeries you have had. Any medical conditions you have. Whether you are pregnant or may be pregnant. How are the results reported? Your test results will be reported as either abnormal or normal. What do the results mean? A normal test result means that you do not have signs of cancer of the cervix. An abnormal result may mean that you have: Cancer. A Pap test by itself is not enough to diagnose cancer. You will have more tests done if cancer is suspected. Precancerous changes in your cervix. Inflammation of the cervix. An STI (sexually transmitted infection). A fungal infection. A parasite infection. Talk with your health care provider about what your results mean. In some cases, your health care provider may do more testing to confirm the results. Questions to ask your health care provider Ask your health care provider, or the department that is doing the test: When will my results be ready? How will I get my results? What are my treatment options? What other tests do I need? What are my next steps? Summary In general, women should have a Pap test every 3 years until they reach menopause or age 64. Your health care provider will collect a sample of cells from the surface of your cervix. This will be done using a small cotton swab, plastic spatula, or brush. In some cases, fluids (secretions) from the cervix or vagina may also be collected. This information is not  intended to replace advice given to you by your health care provider. Make sure you discuss any questions you have with your health care provider. Document Revised: 07/06/2020 Document Reviewed: 07/06/2020 Elsevier Patient Education  2024 [employer].

## 2023-01-06 NOTE — Progress Notes (Signed)
Gynecology Annual Exam  Date of Service: 12/29/2022  PCP: Alan Mulder, MD  Chief Complaint:  Chief Complaint  Patient presents with   Gynecologic Exam    History of Present Illness: Patient is a 52 y.o. G3P3003 presents for annual exam. The patient has no complaints today.   LMP: No LMP recorded. Patient is postmenopausal.  Intermenstrual Bleeding: NA Postcoital Bleeding: no Dysmenorrhea: NA   The patient is sexually active. She currently uses none for contraception. She stopped taking progesterone only pills 3 years ago. She denies dyspareunia.  The patient does perform self breast exams.  There is no notable family history of breast or ovarian cancer in her family.  The patient wears seatbelts: yes.   The patient has regular exercise: She walks and uses stationary bike regularly. She reports healthy diet and adequate hydration and sleep.  The patient denies current symptoms of depression.  She has good stress reducing and coping abilities.  Review of Systems: Review of Systems  Constitutional:  Negative for chills and fever.  HENT:  Negative for congestion, ear discharge, ear pain, hearing loss, sinus pain and sore throat.   Eyes:  Negative for blurred vision and double vision.  Respiratory:  Negative for cough, shortness of breath and wheezing.   Cardiovascular:  Negative for chest pain, palpitations and leg swelling.  Gastrointestinal:  Negative for abdominal pain, blood in stool, constipation, diarrhea, heartburn, melena, nausea and vomiting.  Genitourinary:  Negative for dysuria, flank pain, frequency, hematuria and urgency.  Musculoskeletal:  Negative for back pain, joint pain and myalgias.  Skin:  Negative for itching and rash.  Neurological:  Negative for dizziness, tingling, tremors, sensory change, speech change, focal weakness, seizures, loss of consciousness, weakness and headaches.  Endo/Heme/Allergies:  Negative for environmental allergies. Does not  bruise/bleed easily.  Psychiatric/Behavioral:  Negative for depression, hallucinations, memory loss, substance abuse and suicidal ideas. The patient is not nervous/anxious and does not have insomnia.     Past Medical History:  Patient Active Problem List   Diagnosis Date Noted Date Diagnosed   Plantar fasciitis of right foot 03/03/2019    Adenomyosis 05/21/2017     Past Surgical History:  Past Surgical History:  Procedure Laterality Date   COLONOSCOPY WITH PROPOFOL N/A 04/27/2020   Procedure: COLONOSCOPY WITH PROPOFOL;  Surgeon: Regis Bill, MD;  Location: [facility] ENDOSCOPY;  Service: Endoscopy;  Laterality: N/A;   INTRAUTERINE DEVICE (IUD) INSERTION  2004   MIRENA   NOVASURE ABLATION     TUBAL LIGATION     PP    Gynecologic History:  No LMP recorded. Patient is postmenopausal.  Last Pap: 2022 Results were:  no abnormalities  Last mammogram: 2022 Results were: BI-RAD I  Obstetric History: G3P3003  Family History:  Family History  Problem Relation Age of Onset   Cancer Mother 56       COLON   Diabetes Mother    Cancer Maternal Grandmother 70       COLON; OVARY-80   Breast cancer Maternal Grandmother    Breast cancer Paternal Grandmother 3   Breast cancer Paternal Aunt 57    Social History:  Social History   Socioeconomic History   Marital status: Married    Spouse name: Not on file   Number of children: 3   Years of education: 16   Highest education level: Not on file  Occupational History   Not on file  Tobacco Use   Smoking status: Never   Smokeless tobacco: Never  Vaping Use   Vaping status: Never Used  Substance and Sexual Activity   Alcohol use: Yes    Comment: rarely   Drug use: No   Sexual activity: Yes    Birth control/protection: Post-menopausal  Other Topics Concern   Not on file  Social History Narrative   Not on file   Social Determinants of Health   Financial Resource Strain: Not on file  Food Insecurity: Not on file   Transportation Needs: Not on file  Physical Activity: Not on file  Stress: Not on file  Social Connections: Not on file  Intimate Partner Violence: Not on file    Allergies:  Allergies  Allergen Reactions   Penicillins Other (See Comments)    Unknown, has been told since she was a child   Sulfa Antibiotics     Medications: Prior to Admission medications   Medication Sig Start Date End Date Taking? Authorizing Provider  B-D ULTRAFINE III SHORT PEN 31G X 8 MM MISC Inject into the skin 3 (three) times daily. 10/31/22  Yes [provider]  glipiZIDE (GLUCOTROL XL) 5 MG 24 hr tablet Take 5 mg by mouth every morning. 10/31/22  Yes [provider]  insulin aspart (NOVOLOG) 100 UNIT/ML injection Inject into the skin 3 (three) times daily before meals. As Needed Before Meals   Yes [provider]  [facility] VERIO test strip Apply 1 strip topically daily. 03/18/17  Yes [provider]  OZEMPIC, 1 MG/DOSE, 4 MG/3ML SOPN Inject 1 mg into the skin once a week. 08/19/22  Yes [provider]  TRIJARDY XR 12.5-2.08-998 MG TB24 Take 1 tablet by mouth 2 (two) times daily. 07/25/19  Yes [provider]  Vitamin D, Ergocalciferol, (DRISDOL) 50000 units CAPS capsule Take 50,000 Units by mouth once a week. 04/18/17  Yes [provider]    Physical Exam Vitals: Blood pressure 136/78, pulse 64, height 5\' 7"  (1.702 m), weight 200 lb 14.4 oz (91.1 kg).  General: NAD HEENT: normocephalic, anicteric Thyroid: no enlargement, no palpable nodules Pulmonary: No increased work of breathing, CTAB Cardiovascular: RRR, distal pulses 2+ Breast: Breast symmetrical, no tenderness, no palpable nodules or masses, no skin or nipple retraction present, no nipple discharge.  No axillary or supraclavicular lymphadenopathy. Abdomen: NABS, soft, non-tender, non-distended.  Umbilicus without lesions.  No hepatomegaly, splenomegaly or masses palpable. No evidence of  hernia  Genitourinary:  External: Normal external female genitalia.  Normal urethral meatus, normal Bartholin's and Skene's glands.    Vagina: Normal vaginal mucosa, no evidence of prolapse.    Cervix: Grossly normal in appearance, no bleeding  Uterus: Non-enlarged, mobile, normal contour.  No CMT  Adnexa: ovaries non-enlarged, no adnexal masses  Rectal: deferred  Lymphatic: no evidence of inguinal lymphadenopathy Extremities: no edema, erythema, or tenderness Neurologic: Grossly intact Psychiatric: mood appropriate, affect full   Assessment: 52 y.o. G3P3003 routine annual exam  Plan: Problem List Items Addressed This Visit   None Visit Diagnoses     Well woman exam without gynecological exam    -  Primary   Encounter for screening mammogram for malignant neoplasm of breast       Relevant Orders   MM 3D SCREENING MAMMOGRAM BILATERAL BREAST       1) Mammogram - recommend yearly screening mammogram.  Mammogram Was ordered today  2) STI screening  was offered and declined  3) ASCCP guidelines and rationale discussed.  Patient opts for every 3 years screening interval  4) Contraception - the patient  is currently using  none.  She is  postmenopausal.  5) Colonoscopy -- Screening recommended starting at age 23 for average risk individuals, age 33 for individuals deemed at increased risk (including African Americans) and recommended to continue until age 65.  For patient age 52-85 individualized approach is recommended.  Gold standard screening is via colonoscopy, Cologuard screening is an acceptable alternative for patient unwilling or unable to undergo colonoscopy.  "Colorectal cancer screening for average?risk adults: 2018 guideline update from the American Cancer Society"CA: A Cancer Journal for Clinicians: Sep 17, 2016. UTD done in 2022   6) Routine healthcare maintenance including cholesterol, diabetes screening discussed Declines  7) Return in about 1 year (around 12/29/2023)  for annual established gyn.   Tresea Mall, CNM [facility] 01/06/2023 12:41 PM

## 2023-01-29 ENCOUNTER — Ambulatory Visit: Payer: 59

## 2023-01-29 ENCOUNTER — Ambulatory Visit
Admission: RE | Admit: 2023-01-29 | Discharge: 2023-01-29 | Disposition: A | Discharge status: Home / Self Care | Payer: 59 | Source: Ambulatory Visit | Attending: Advanced Practice Midwife | Admitting: Advanced Practice Midwife

## 2023-01-29 DIAGNOSIS — Z1231 Encounter for screening mammogram for malignant neoplasm of breast: Secondary | ICD-10-CM

## 2023-02-24 ENCOUNTER — Other Ambulatory Visit: Payer: Self-pay | Admitting: Nurse Practitioner

## 2023-02-24 ENCOUNTER — Encounter: Payer: Self-pay | Admitting: Nurse Practitioner

## 2023-02-24 ENCOUNTER — Ambulatory Visit: Payer: 59 | Admitting: Nurse Practitioner

## 2023-02-24 VITALS — BP 128/86 | HR 72 | Temp 98.3°F | Ht 67.0 in | Wt 202.6 lb

## 2023-02-24 DIAGNOSIS — E559 Vitamin D deficiency, unspecified: Secondary | ICD-10-CM

## 2023-02-24 DIAGNOSIS — E1169 Type 2 diabetes mellitus with other specified complication: Secondary | ICD-10-CM

## 2023-02-24 DIAGNOSIS — E1165 Type 2 diabetes mellitus with hyperglycemia: Secondary | ICD-10-CM | POA: Diagnosis not present

## 2023-02-24 DIAGNOSIS — Z7984 Long term (current) use of oral hypoglycemic drugs: Secondary | ICD-10-CM

## 2023-02-24 DIAGNOSIS — Z1329 Encounter for screening for other suspected endocrine disorder: Secondary | ICD-10-CM

## 2023-02-24 DIAGNOSIS — Z7985 Long-term (current) use of injectable non-insulin antidiabetic drugs: Secondary | ICD-10-CM | POA: Diagnosis not present

## 2023-02-24 DIAGNOSIS — E785 Hyperlipidemia, unspecified: Secondary | ICD-10-CM | POA: Diagnosis not present

## 2023-02-24 MED ORDER — TIRZEPATIDE 2.5 MG/0.5ML ~~LOC~~ SOAJ
2.5000 mg | SUBCUTANEOUS | 0 refills | Status: DC
Start: 2023-02-24 — End: 2023-03-16

## 2023-02-24 MED ORDER — FREESTYLE LIBRE 3 SENSOR MISC: 11 refills | Status: DC

## 2023-02-24 MED ORDER — TIRZEPATIDE 5 MG/0.5ML ~~LOC~~ SOAJ
5.0000 mg | SUBCUTANEOUS | 0 refills | Status: DC
Start: 2023-02-24 — End: 2023-04-07

## 2023-02-24 NOTE — Assessment & Plan Note (Signed)
They have been off medications since August 2024, previously managed with Glipizide, Ozempic, NovoLog, and Trijardy XR. They report excessive urination and thirst, indicating poor glycemic control. Their last known A1c was 5.9, but their current status is unknown. We will start Mounjaro 2.5mg  for 4 weeks, then increase to 5mg  if tolerated and continue Glipizide. Counseled on the risk of pancreatitis and gallbladder disease. Discussed the risk of nausea. Advised to discontinue the [facility] and contact us immediately if they develop abdominal pain. If they develop excessive nausea they will contact us right away. I discussed that medullary thyroid cancer has been seen in rats studies. The patient confirmed no personal or family history of thyroid cancer, parathyroid cancer, or adrenal gland cancer. Discussed that we thus far have not seen medullary thyroid cancer result from use of this type of medication in humans. Advised to monitor the thyroid area and contact us for any lumps, swelling, trouble swallowing, or any other changes in this area.  Discussed goal weight loss of 1 to 2 pounds a week while on this medication. Discussed the importance of healthy diet, exercise and lifestyle modifications even with this medication. We will check A1c today. Depending on A1c results, we may add NovoLog or Trijardy XR. We encourage them to adjust their diet and exercise, specifically to increase protein and decrease carbohydrates. We will order a Freestyle Libre 3 sensor.

## 2023-02-24 NOTE — Assessment & Plan Note (Signed)
Currently taking a vitamin D supplement. Will check vitamin D level today.

## 2023-02-24 NOTE — Progress Notes (Signed)
Bethanie Dicker, NP-C Phone: [phone]  Alice Rodriguez is a 52 y.o. female who presents today to establish care.   Discussed the use of AI scribe software for clinical note transcription with the patient, who gave verbal consent to proceed.  History of Present Illness   The patient, with a known history of type 2 diabetes, presents for a new patient consultation. They report being off their diabetes medications since August, including Ozempic, NovoLog, and Trijardy XR, and have only been taking glipizide and vitamin D. They have not been monitoring their blood sugar levels at home. They report experiencing excessive urination and increased thirst, which they recognize as signs of high blood sugar. They also note blurry vision, another potential sign of elevated blood sugar levels.  The patient has a history of back and neck pain, for which they were previously seeing a physical medicine and rehabilitation specialist, but they are no longer under their care. They also report a significant weight loss in the past, losing 40 pounds while on Ozempic.  The patient's diet consists of fast foods, vegetables, salads, fish, and some carbohydrates like rice and potatoes. They report not eating a lot and have maintained their weight since stopping their diabetes medications in August. They also have an exercise app, but they admit to not using it regularly.  The patient is also dealing with significant personal stress, as their spouse is battling cancer and is no longer responding to chemotherapy and radiation treatments. Despite this, they deny needing any help with their mood. PHQ- 4 and GAD- 3 today.  The patient's last known A1C was 5.9, but they have not had any recent lab work since before July. They previously used a [employer] 3 monitor to check their blood sugar levels, but they ran out of sensors and have not been checking their blood sugar levels at home. They express interest in restarting the  use of a glucose monitor if their insurance will cover it.      Active Ambulatory Problems    Diagnosis Date Noted   Adenomyosis 05/21/2017   Plantar fasciitis of right foot 03/03/2019   Type 2 diabetes mellitus with hyperglycemia, without long-term current use of insulin (HCC) 02/24/2023   Vitamin D deficiency 02/24/2023   Hyperlipidemia associated with type 2 diabetes mellitus (HCC) 02/24/2023   Resolved Ambulatory Problems    Diagnosis Date Noted   No Resolved Ambulatory Problems   Past Medical History:  Diagnosis Date   BRCA negative    Diabetes mellitus without complication (HCC) 09/2011   Family history of colon cancer    Family history of ovarian cancer    History of mammogram 09/23/2011; 10/31/13   History of Papanicolaou smear of cervix 2006; 10/31/13    Family History  Problem Relation Age of Onset   Cancer Mother 73       COLON   Diabetes Mother    Cancer Maternal Grandmother 64       COLON; OVARY-80   Breast cancer Maternal Grandmother    Breast cancer Paternal Grandmother 38   Breast cancer Paternal Aunt 79    Social History   Socioeconomic History   Marital status: Married    Spouse name: Not on file   Number of children: 3   Years of education: 16   Highest education level: Not on file  Occupational History   Not on file  Tobacco Use   Smoking status: Never   Smokeless tobacco: Never  Vaping Use   Vaping  status: Never Used  Substance and Sexual Activity   Alcohol use: Yes    Comment: rarely   Drug use: No   Sexual activity: Yes    Birth control/protection: Post-menopausal  Other Topics Concern   Not on file  Social History Narrative   Not on file   Social Determinants of Health   Financial Resource Strain: Not on file  Food Insecurity: Not on file  Transportation Needs: Not on file  Physical Activity: Not on file  Stress: Not on file  Social Connections: Not on file  Intimate Partner Violence: Not on file    ROS  General:   Negative for unexplained weight loss, fever Skin: Negative for new or changing mole, sore that won't heal HEENT: Negative for trouble hearing, trouble seeing, ringing in ears, mouth sores, hoarseness, change in voice, dysphagia. CV:  Negative for chest pain, dyspnea, edema, palpitations Resp: Negative for cough, dyspnea, hemoptysis GI: Negative for nausea, vomiting, diarrhea, constipation, abdominal pain, melena, hematochezia. GU: Negative for dysuria, incontinence, urinary hesitance, hematuria, vaginal or penile discharge, sexual difficulty, lumps in testicle or breasts MSK: Negative for muscle cramps or aches, joint pain or swelling Neuro: Negative for headaches, weakness, numbness, dizziness, passing out/fainting Psych: Negative for depression, anxiety, memory problems  Objective  Physical Exam Vitals:   02/24/23 1557  BP: 128/86  Pulse: 72  Temp: 98.3 F (36.8 C)  SpO2: 96%    BP Readings from Last 3 Encounters:  02/24/23 128/86  12/29/22 136/78  01/24/21 100/60   Wt Readings from Last 3 Encounters:  02/24/23 202 lb 9.6 oz (91.9 kg)  12/29/22 200 lb 14.4 oz (91.1 kg)  01/24/21 197 lb (89.4 kg)    Physical Exam Constitutional:      General: She is not in acute distress.    Appearance: Normal appearance.  HENT:     Head: Normocephalic.  Cardiovascular:     Rate and Rhythm: Normal rate and regular rhythm.     Heart sounds: Normal heart sounds.  Pulmonary:     Effort: Pulmonary effort is normal.     Breath sounds: Normal breath sounds.  Skin:    General: Skin is warm and dry.  Neurological:     General: No focal deficit present.     Mental Status: She is alert.  Psychiatric:        Mood and Affect: Mood normal.        Behavior: Behavior normal.    Assessment/Plan:   Type 2 diabetes mellitus with hyperglycemia, without long-term current use of insulin (HCC) Assessment & Plan: They have been off medications since August 2024, previously managed with  Glipizide, Ozempic, NovoLog, and Trijardy XR. They report excessive urination and thirst, indicating poor glycemic control. Their last known A1c was 5.9, but their current status is unknown. We will start Mounjaro 2.5mg  for 4 weeks, then increase to 5mg  if tolerated and continue Glipizide. Counseled on the risk of pancreatitis and gallbladder disease. Discussed the risk of nausea. Advised to discontinue the [facility] and contact us immediately if they develop abdominal pain. If they develop excessive nausea they will contact us right away. I discussed that medullary thyroid cancer has been seen in rats studies. The patient confirmed no personal or family history of thyroid cancer, parathyroid cancer, or adrenal gland cancer. Discussed that we thus far have not seen medullary thyroid cancer result from use of this type of medication in humans. Advised to monitor the thyroid area and contact us for any lumps, swelling,  trouble swallowing, or any other changes in this area.  Discussed goal weight loss of 1 to 2 pounds a week while on this medication. Discussed the importance of healthy diet, exercise and lifestyle modifications even with this medication. We will check A1c today. Depending on A1c results, we may add NovoLog or Trijardy XR. We encourage them to adjust their diet and exercise, specifically to increase protein and decrease carbohydrates. We will order a Freestyle Libre 3 sensor.   Orders: -     CBC with Differential/Platelet -     Comprehensive metabolic panel -     Hemoglobin A1c -     Tirzepatide; Inject 2.5 mg into the skin once a week. X 4 weeks then increase to 5 mg  Dispense: 2 mL; Refill: 0 -     Tirzepatide; Inject 5 mg into the skin once a week. X 4 weeks  Dispense: 2 mL; Refill: 0 -     FreeStyle Libre 3 Sensor; Place 1 sensor on the skin every 14 days. Use to check glucose continuously  Dispense: 2 each; Refill: 11  Vitamin D deficiency Assessment & Plan: Currently taking a vitamin D  supplement. Will check vitamin D level today.   Orders: -     VITAMIN D 25 Hydroxy (Vit-D Deficiency, Fractures)  Hyperlipidemia associated with type 2 diabetes mellitus (HCC) Assessment & Plan: Not on any medications. Labs from previous provider showed LDL of 81 in January. Will check lipid panel today. Encouraged healthy diet and exercise.   Orders: -     Lipid panel  Thyroid disorder screen -     TSH    Return in about 6 weeks (around 04/07/2023) for Follow up.   Bethanie Dicker, NP-C [facility] - [employer]

## 2023-02-24 NOTE — Assessment & Plan Note (Addendum)
Not on any medications. Labs from previous provider showed LDL of 81 in January. Will check lipid panel today. Encouraged healthy diet and exercise.

## 2023-02-25 ENCOUNTER — Telehealth: Payer: Self-pay

## 2023-02-25 LAB — CBC WITH DIFFERENTIAL/PLATELET
Basophils Absolute: 0.1 10*3/uL (ref 0.0–0.1)
Basophils Relative: 0.8 % (ref 0.0–3.0)
Eosinophils Absolute: 0.1 10*3/uL (ref 0.0–0.7)
Eosinophils Relative: 1.1 % (ref 0.0–5.0)
HCT: 43 % (ref 36.0–46.0)
Hemoglobin: 14.3 g/dL (ref 12.0–15.0)
Lymphocytes Relative: 36.9 % (ref 12.0–46.0)
Lymphs Abs: 3.4 10*3/uL (ref 0.7–4.0)
MCHC: 33.2 g/dL (ref 30.0–36.0)
MCV: 93.4 fL (ref 78.0–100.0)
Monocytes Absolute: 0.5 10*3/uL (ref 0.1–1.0)
Monocytes Relative: 5.8 % (ref 3.0–12.0)
Neutro Abs: 5.1 10*3/uL (ref 1.4–7.7)
Neutrophils Relative %: 55.4 % (ref 43.0–77.0)
Platelets: 272 10*3/uL (ref 150.0–400.0)
RBC: 4.6 Mil/uL (ref 3.87–5.11)
RDW: 12.5 % (ref 11.5–15.5)
WBC: 9.2 10*3/uL (ref 4.0–10.5)

## 2023-02-25 LAB — COMPREHENSIVE METABOLIC PANEL
ALT: 17 U/L (ref 0–35)
AST: 17 U/L (ref 0–37)
Albumin: 4 g/dL (ref 3.5–5.2)
Alkaline Phosphatase: 73 U/L (ref 39–117)
BUN: 15 mg/dL (ref 6–23)
CO2: 28 meq/L (ref 19–32)
Calcium: 9.8 mg/dL (ref 8.4–10.5)
Chloride: 96 meq/L (ref 96–112)
Creatinine, Ser: 0.9 mg/dL (ref 0.40–1.20)
GFR: 74.03 mL/min (ref >60.00)
Glucose, Bld: 418 mg/dL — ABNORMAL HIGH (ref 70–99)
Potassium: 3.9 meq/L (ref 3.5–5.1)
Sodium: 132 meq/L — ABNORMAL LOW (ref 135–145)
Total Bilirubin: 0.3 mg/dL (ref 0.2–1.2)
Total Protein: 7.2 g/dL (ref 6.0–8.3)

## 2023-02-25 LAB — LIPID PANEL
Cholesterol: 185 mg/dL (ref 0–200)
HDL: 49 mg/dL (ref >39.00)
LDL Cholesterol: 59 mg/dL (ref 0–99)
NonHDL: 136.2
Total CHOL/HDL Ratio: 4
Triglycerides: 386 mg/dL — ABNORMAL HIGH (ref 0.0–149.0)
VLDL: 77.2 mg/dL — ABNORMAL HIGH (ref 0.0–40.0)

## 2023-02-25 LAB — VITAMIN D 25 HYDROXY (VIT D DEFICIENCY, FRACTURES): VITD: 27.88 ng/mL — ABNORMAL LOW (ref 30.00–100.00)

## 2023-02-25 LAB — TSH: TSH: 1.57 u[IU]/mL (ref 0.35–5.50)

## 2023-02-25 LAB — HEMOGLOBIN A1C: Hgb A1c MFr Bld: 10.9 % — ABNORMAL HIGH (ref 4.6–6.5)

## 2023-02-25 NOTE — Telephone Encounter (Signed)
LVM to CB in regards to labs

## 2023-02-25 NOTE — Telephone Encounter (Signed)
2nd attempt to contact pt and it went straight to vm did not leave a 2nd vm

## 2023-02-26 NOTE — Telephone Encounter (Signed)
3rd attempt to reach pt by phone. Call went straight to VM.    I have sent pt a mychart msg with lab results

## 2023-02-27 ENCOUNTER — Other Ambulatory Visit: Payer: Self-pay | Admitting: Nurse Practitioner

## 2023-02-27 ENCOUNTER — Telehealth: Payer: Self-pay

## 2023-02-27 DIAGNOSIS — E1165 Type 2 diabetes mellitus with hyperglycemia: Secondary | ICD-10-CM

## 2023-02-27 MED ORDER — GLIPIZIDE ER 10 MG PO TB24: 10.0000 mg | ORAL_TABLET | Freq: Every day | ORAL | 3 refills | Status: DC

## 2023-02-27 NOTE — Telephone Encounter (Signed)
Pt needs a PA on Mounjaro   Pt has Dx of Type 2 diabetes  Provider Bethanie Dicker, NP

## 2023-02-27 NOTE — Telephone Encounter (Signed)
PA need for Victoza 18mg   Provider Bethanie Dicker, NP

## 2023-03-02 ENCOUNTER — Other Ambulatory Visit (HOSPITAL_COMMUNITY): Payer: Self-pay

## 2023-03-03 ENCOUNTER — Other Ambulatory Visit: Payer: Self-pay | Admitting: Nurse Practitioner

## 2023-03-03 DIAGNOSIS — E1165 Type 2 diabetes mellitus with hyperglycemia: Secondary | ICD-10-CM

## 2023-03-05 NOTE — Telephone Encounter (Signed)
Pt would like an actual Prior Auth sent in not a test claim pt called her insurance and they informed her a PA was not done.

## 2023-03-06 ENCOUNTER — Other Ambulatory Visit (HOSPITAL_COMMUNITY): Payer: Self-pay

## 2023-03-06 ENCOUNTER — Telehealth: Payer: Self-pay

## 2023-03-06 NOTE — Telephone Encounter (Signed)
Mounjaro approved through [employer] (please see encounter on 03/06/23). Please advise if PA is still required for Victoza.

## 2023-03-06 NOTE — Telephone Encounter (Signed)
Pharmacy Patient Advocate Encounter   Received notification from Pt Calls Messages that prior authorization for [facility] is required/requested.   Insurance verification completed.   The patient is insured through [employer] .   Per test claim: APPROVED from 02/04/23 to 03/05/24. Ran test claim, Copay is $25. This test claim was processed through [facility]- copay amounts may vary at other pharmacies due to pharmacy/plan contracts, or as the patient moves through the different stages of their insurance plan.    Key: [id number] PA Case ID #: [id number]

## 2023-03-06 NOTE — Telephone Encounter (Signed)
Pharmacy Patient Advocate Encounter   Received notification from Pt Calls Messages that prior authorization for Mounjaro 2.5MG /0.5ML auto-injectors  is required/requested.   Insurance verification completed.   The patient is insured through [facility] .   Per test claim: PA required; PA submitted to above mentioned insurance via CoverMyMeds Key/confirmation #/EOC [id number] Status is pending

## 2023-03-06 NOTE — Telephone Encounter (Addendum)
Pharmacy Patient Advocate Encounter  Received notification from [facility] that Prior Authorization for [facility] has been DENIED.  Full denial letter will be uploaded to the media tab. See denial reason below.   PA #/Case ID/Reference #: [id number]   DENIAL REASON: A) You have tried other products your plan covers (preferred products), and they did not work well for you, or B) Your doctor gives [place] a medical reason you cannot take those other products. For your plan, you may need to try up to three preferred products. We have denied your request because you do not meet any of these conditions. We reviewed the information we had. Your request has been denied. Your doctor can send [place] any new or missing information for [place] to review. The preferred products for your plan are: Trulicity, Victoza (liraglutide).

## 2023-03-12 NOTE — Telephone Encounter (Signed)
Pt called stating her mounjaro was [facility] [address]

## 2023-03-13 NOTE — Telephone Encounter (Signed)
Care team updated and letter sent for eye exam notes.

## 2023-03-16 ENCOUNTER — Other Ambulatory Visit: Payer: Self-pay | Admitting: Nurse Practitioner

## 2023-03-16 DIAGNOSIS — E1165 Type 2 diabetes mellitus with hyperglycemia: Secondary | ICD-10-CM

## 2023-03-16 MED ORDER — TIRZEPATIDE 2.5 MG/0.5ML ~~LOC~~ SOAJ: 2.5000 mg | SUBCUTANEOUS | 0 refills | Status: DC

## 2023-03-16 NOTE — Addendum Note (Signed)
Addended by: Donavan Foil on: 03/16/2023 08:08 AM   Modules accepted: Orders

## 2023-04-07 ENCOUNTER — Other Ambulatory Visit: Payer: Self-pay | Admitting: Nurse Practitioner

## 2023-04-07 ENCOUNTER — Ambulatory Visit: Payer: 59 | Admitting: Nurse Practitioner

## 2023-04-07 ENCOUNTER — Encounter: Payer: Self-pay | Admitting: Nurse Practitioner

## 2023-04-07 ENCOUNTER — Telehealth: Payer: Self-pay

## 2023-04-07 VITALS — BP 118/82 | HR 69 | Temp 98.0°F | Ht 67.0 in | Wt 201.6 lb

## 2023-04-07 DIAGNOSIS — E1165 Type 2 diabetes mellitus with hyperglycemia: Secondary | ICD-10-CM | POA: Diagnosis not present

## 2023-04-07 DIAGNOSIS — Z794 Long term (current) use of insulin: Secondary | ICD-10-CM | POA: Diagnosis not present

## 2023-04-07 DIAGNOSIS — E785 Hyperlipidemia, unspecified: Secondary | ICD-10-CM

## 2023-04-07 DIAGNOSIS — E559 Vitamin D deficiency, unspecified: Secondary | ICD-10-CM | POA: Diagnosis not present

## 2023-04-07 DIAGNOSIS — Z7985 Long-term (current) use of injectable non-insulin antidiabetic drugs: Secondary | ICD-10-CM | POA: Diagnosis not present

## 2023-04-07 DIAGNOSIS — E1169 Type 2 diabetes mellitus with other specified complication: Secondary | ICD-10-CM

## 2023-04-07 MED ORDER — VITAMIN D (ERGOCALCIFEROL) 1.25 MG (50000 UNIT) PO CAPS: 50000.0000 [IU] | ORAL_CAPSULE | ORAL | 1 refills | Status: DC

## 2023-04-07 MED ORDER — TIRZEPATIDE 5 MG/0.5ML ~~LOC~~ SOAJ: 5.0000 mg | SUBCUTANEOUS | 0 refills | Status: DC

## 2023-04-07 MED ORDER — TIRZEPATIDE 7.5 MG/0.5ML ~~LOC~~ SOAJ
7.5000 mg | SUBCUTANEOUS | 0 refills | Status: DC
Start: 2023-04-07 — End: 2023-05-25

## 2023-04-07 NOTE — Assessment & Plan Note (Signed)
Recent A1c of 10.9 indicates poor glycemic control, despite the patient reporting improvement in symptoms of hyperglycemia and using a Libre device to monitor blood glucose levels, with a recent 7-day average glucose level of 132. They have been taking glipizide 10mg  daily, Mounjaro 2.5mg  weekly, and Toujeo 33 units at bedtime. We will increase Mounjaro to 5mg  weekly for 4 weeks, then to 7.5mg  weekly, and reduce Toujeo to 20 units at bedtime while continuing glipizide 10mg  daily. A1c will be rechecked in 2 months.

## 2023-04-07 NOTE — Assessment & Plan Note (Addendum)
They have been on high-dose weekly vitamin D supplementation for an extended period and report persistent low levels as evidenced in recent lab work. We will refill the high-dose weekly vitamin D supplement and recheck the vitamin D level in the future.

## 2023-04-07 NOTE — Progress Notes (Signed)
Bethanie Dicker, NP-C Phone: [phone]  Alice Rodriguez is a 52 y.o. female who presents today for follow up.   Discussed the use of AI scribe software for clinical note transcription with the patient, who gave verbal consent to proceed.  History of Present Illness   The patient, with a history of diabetes, has been on a regimen of Mounjaro, glipizide, and Toujeo. They report no adverse effects such as nausea, vomiting, or abdominal pain since the initiation of Mounjaro. The patient has completed four weeks of Mounjaro and is due to increase the dosage to five milligrams. They also report that their blood glucose levels have been well-controlled, with a seven-day average of 132 and a fourteen-day average of 136, as monitored by their [place] device. However, they experienced a hypoglycemic episode last night with a blood glucose level dropping to 63.  The patient's recent HbA1c was significantly elevated at 10.9, but they report that their blood glucose levels have been decreasing, as evidenced by the need for multiple adjustments to their eyeglasses prescription. They deny experiencing excessive thirst or urination.  In addition to their diabetes management, the patient has been on a once-weekly vitamin D supplement for several years due to persistently low levels. They also report needing additional sensors for their [place] device.  The patient has been managing their diabetes with insulin (Toujeo), administering 33 units at bedtime. However, with the recent hypoglycemic episode and the upcoming increase in [facility] dosage, there is a concern about potential over-control of their blood glucose levels.      Social History   Tobacco Use  Smoking Status Never  Smokeless Tobacco Never    Current Outpatient Medications on File Prior to Visit  Medication Sig Dispense Refill   B-D ULTRAFINE III SHORT PEN 31G X 8 MM MISC Inject into the skin 3 (three) times daily.     Continuous Glucose Sensor  (FREESTYLE LIBRE 3 SENSOR) MISC Place 1 sensor on the skin every 14 days. Use to check glucose continuously 2 each 11   glipiZIDE (GLUCOTROL XL) 10 MG 24 hr tablet Take 1 tablet (10 mg total) by mouth daily with breakfast. 90 tablet 3   insulin glargine, 1 Unit Dial, (TOUJEO SOLOSTAR) 300 UNIT/ML Solostar Pen Inject 20 Units into the skin at bedtime.     ONETOUCH VERIO test strip Apply 1 strip topically daily.  0   No current facility-administered medications on file prior to visit.    ROS see history of present illness  Objective  Physical Exam Vitals:   04/07/23 0819  BP: 118/82  Pulse: 69  Temp: 98 F (36.7 C)  SpO2: 100%    BP Readings from Last 3 Encounters:  04/07/23 118/82  02/24/23 128/86  12/29/22 136/78   Wt Readings from Last 3 Encounters:  04/07/23 201 lb 9.6 oz (91.4 kg)  02/24/23 202 lb 9.6 oz (91.9 kg)  12/29/22 200 lb 14.4 oz (91.1 kg)    Physical Exam Constitutional:      General: She is not in acute distress.    Appearance: Normal appearance.  HENT:     Head: Normocephalic.  Cardiovascular:     Rate and Rhythm: Normal rate and regular rhythm.     Heart sounds: Normal heart sounds.  Pulmonary:     Effort: Pulmonary effort is normal.     Breath sounds: Normal breath sounds.  Skin:    General: Skin is warm and dry.  Neurological:     General: No focal deficit present.  Mental Status: She is alert.  Psychiatric:        Mood and Affect: Mood normal.        Behavior: Behavior normal.    Assessment/Plan: Please see individual problem list.  Type 2 diabetes mellitus with hyperglycemia, without long-term current use of insulin (HCC) Assessment & Plan: Recent A1c of 10.9 indicates poor glycemic control, despite the patient reporting improvement in symptoms of hyperglycemia and using a Libre device to monitor blood glucose levels, with a recent 7-day average glucose level of 132. They have been taking glipizide 10mg  daily, Mounjaro 2.5mg  weekly,  and Toujeo 33 units at bedtime. We will increase Mounjaro to 5mg  weekly for 4 weeks, then to 7.5mg  weekly, and reduce Toujeo to 20 units at bedtime while continuing glipizide 10mg  daily. A1c will be rechecked in 2 months.  Orders: -     Tirzepatide; Inject 5 mg into the skin once a week. X 4 weeks  Dispense: 2 mL; Refill: 0 -     Tirzepatide; Inject 7.5 mg into the skin once a week.  Dispense: 2 mL; Refill: 0  Vitamin D deficiency Assessment & Plan: They have been on high-dose weekly vitamin D supplementation for an extended period and report persistent low levels as evidenced in recent lab work. We will refill the high-dose weekly vitamin D supplement and recheck the vitamin D level in the future.  Orders: -     Vitamin D (Ergocalciferol); Take 1 capsule (50,000 Units total) by mouth once a week.  Dispense: 13 capsule; Refill: 1  Hyperlipidemia associated with type 2 diabetes mellitus (HCC) Assessment & Plan: Recent labs showed elevated triglycerides, likely secondary to poorly controlled diabetes. We expect improvement with better glycemic control and will recheck in the future. LDL- 59.    Return in about 2 months (around 06/08/2023) for Follow up.   Bethanie Dicker, NP-C [facility] - [employer]

## 2023-04-07 NOTE — Telephone Encounter (Signed)
[facility] and [facility] stated [facility] just dont have the free style libre in stock but they will order it and have it in on tomorrow.   Pt notified via mychart

## 2023-04-07 NOTE — Assessment & Plan Note (Signed)
Recent labs showed elevated triglycerides, likely secondary to poorly controlled diabetes. We expect improvement with better glycemic control and will recheck in the future. LDL- 59.

## 2023-04-13 ENCOUNTER — Other Ambulatory Visit: Payer: Self-pay | Admitting: Nurse Practitioner

## 2023-04-13 DIAGNOSIS — E1165 Type 2 diabetes mellitus with hyperglycemia: Secondary | ICD-10-CM

## 2023-05-05 ENCOUNTER — Telehealth: Payer: Self-pay

## 2023-05-05 NOTE — Progress Notes (Signed)
 Care Guide Pharmacy Note  05/05/2023 Name: TAMIE MINTEER MRN: [id number] DOB: 1970/06/26  Referred By: Gretel App, NP Reason for referral: Care Coordination ([facility] Diabetes. )   AMBAR RAPHAEL is a 53 y.o. year old female who is a primary care patient of Gretel App, NP.  Tylene KATHEE Salt was referred to the pharmacist for assistance related to: DMII  Successful contact was made with the patient to discuss pharmacy services.  Patient declines engagement at this time. Contact information was provided to the patient should they wish to reach out for assistance at a later time.  Dreama [facility] Assistant [phone]

## 2023-05-25 ENCOUNTER — Other Ambulatory Visit: Payer: Self-pay | Admitting: Nurse Practitioner

## 2023-05-25 DIAGNOSIS — E1165 Type 2 diabetes mellitus with hyperglycemia: Secondary | ICD-10-CM

## 2023-05-25 NOTE — Telephone Encounter (Signed)
Detailed vm left as well as mychart sent asking if pt would like to remain at the 7.5mg  or move up to the 10 mg.

## 2023-05-28 ENCOUNTER — Other Ambulatory Visit: Payer: Self-pay | Admitting: Nurse Practitioner

## 2023-05-28 MED ORDER — TIRZEPATIDE 10 MG/0.5ML ~~LOC~~ SOAJ: 10.0000 mg | SUBCUTANEOUS | 0 refills | Status: DC

## 2023-05-28 NOTE — Telephone Encounter (Signed)
 Medication resent

## 2023-05-28 NOTE — Addendum Note (Signed)
 Addended by: Jackline Maser on: 05/28/2023 08:34 AM   Modules accepted: Orders

## 2023-06-03 ENCOUNTER — Telehealth: Payer: Self-pay

## 2023-06-03 NOTE — Telephone Encounter (Signed)
Patient was identified as falling into the [place] Measure - Diabetes.   Patient was: Appointment scheduled with primary care provider in the next 30 days.

## 2023-06-09 ENCOUNTER — Telehealth: Payer: Self-pay

## 2023-06-09 ENCOUNTER — Encounter: Payer: Self-pay | Admitting: Nurse Practitioner

## 2023-06-09 NOTE — Telephone Encounter (Signed)
 I left voicemail for patient and sent letter to patient via MyChart letting her know that we need to reschedule her 06/10/2023 visit with Bethanie Dicker, NP, due to inclement weather.  When patient calls back, please reschedule this visit.

## 2023-06-10 ENCOUNTER — Ambulatory Visit: Payer: 59 | Admitting: Nurse Practitioner

## 2023-06-15 ENCOUNTER — Telehealth: Payer: Self-pay

## 2023-06-15 ENCOUNTER — Other Ambulatory Visit (HOSPITAL_COMMUNITY): Payer: Self-pay

## 2023-06-15 NOTE — Telephone Encounter (Signed)
 PA has been submitted and documented in separate encounter, please sign off on rx in this encounter as PA team is unable to resolve RX requests. Thank you

## 2023-06-15 NOTE — Telephone Encounter (Signed)
 Pharmacy Patient Advocate Encounter   Received notification from RX Request Messages that prior authorization for [facility] is required/requested.   Insurance verification completed.   The patient is insured through [employer] .   Per test claim: Refill too soon. PA is not needed at this time. Medication was filled 05/28/2023. Next eligible fill date is Not given.

## 2023-06-16 ENCOUNTER — Ambulatory Visit: Payer: 59 | Admitting: Nurse Practitioner

## 2023-06-16 ENCOUNTER — Other Ambulatory Visit: Payer: Self-pay | Admitting: Nurse Practitioner

## 2023-06-16 VITALS — BP 124/88 | HR 67 | Temp 97.5°F | Ht 67.0 in | Wt 200.2 lb

## 2023-06-16 DIAGNOSIS — E669 Obesity, unspecified: Secondary | ICD-10-CM | POA: Diagnosis not present

## 2023-06-16 DIAGNOSIS — E1169 Type 2 diabetes mellitus with other specified complication: Secondary | ICD-10-CM | POA: Diagnosis not present

## 2023-06-16 DIAGNOSIS — E1165 Type 2 diabetes mellitus with hyperglycemia: Secondary | ICD-10-CM

## 2023-06-16 DIAGNOSIS — Z7984 Long term (current) use of oral hypoglycemic drugs: Secondary | ICD-10-CM

## 2023-06-16 DIAGNOSIS — E785 Hyperlipidemia, unspecified: Secondary | ICD-10-CM | POA: Diagnosis not present

## 2023-06-16 DIAGNOSIS — Z7985 Long-term (current) use of injectable non-insulin antidiabetic drugs: Secondary | ICD-10-CM

## 2023-06-16 LAB — POCT GLYCOSYLATED HEMOGLOBIN (HGB A1C)
HbA1c POC (<> result, manual entry): 5.7 % (ref 4.0–5.6)
HbA1c, POC (controlled diabetic range): 5.7 % (ref 0.0–7.0)
HbA1c, POC (prediabetic range): 5.7 % (ref 5.7–6.4)
Hemoglobin A1C: 5.7 % — AB (ref 4.0–5.6)

## 2023-06-16 MED ORDER — TIRZEPATIDE 12.5 MG/0.5ML ~~LOC~~ SOAJ: 12.5000 mg | SUBCUTANEOUS | 1 refills | Status: DC

## 2023-06-16 NOTE — Assessment & Plan Note (Signed)
 Reports decreased appetite and has started exercising 30 minutes three times a week. Encourage continued exercise and increased protein intake. Consider protein supplements such as shakes or bars.

## 2023-06-16 NOTE — Assessment & Plan Note (Signed)
 Glycemic control has significantly improved, with A1c reduced from 10.9 to 5.7. Currently taking Mounjaro 10mg  weekly, Glipizide daily, and Toujeo 20 units daily. Hypoglycemia reported with Toujeo, so discontinue its use due to hypoglycemia risk and improved A1c. She will also discontinue Glipizide. She will continue to monitor her glucose. Continue Mounjaro 10mg  weekly for 2 more weeks then increase to 12.5 mg weekly. Continue healthy diet and exercise.

## 2023-06-16 NOTE — Assessment & Plan Note (Signed)
 Not on lipid-lowering therapy. Last triglycerides were elevated, likely due to previously uncontrolled diabetes. Recheck lipid panel at the next appointment in 3 months. Encourage healthy diet and exercise.

## 2023-06-16 NOTE — Progress Notes (Signed)
 Bethanie Dicker, NP-C Phone: [phone]  Alice Rodriguez is a 53 y.o. female who presents today for follow up.   Discussed the use of AI scribe software for clinical note transcription with the patient, who gave verbal consent to proceed.  History of Present Illness   Alice Rodriguez is a 53 year old female with type 2 diabetes who presents for a two-month follow-up.  She has been managing her type 2 diabetes with Mounjaro, recently increased to 10 mg weekly, and glipizide once daily. She variably uses Toujeo, omitting it if her blood sugar is under 100 mg/dL before bed due to hypoglycemia, with levels dropping below 50 mg/dL. Recent lab work shows her A1c improved from 10.9 in November to 5.7 currently.  No excessive thirst or urination. Her seven-day average blood sugar is 126 mg/dL, and her 21-HYQ average is 129 mg/dL. She wears a continuous glucose monitor. She experiences some low blood sugar episodes.  Her family follows a healthy diet, and she has been trying to increase her protein intake. She exercises for about 30 minutes, three times a week.  She has been undergoing red light therapy for four months, noting a loss of inches but not significant weight loss. Her current weight is 200 pounds.      Social History   Tobacco Use  Smoking Status Never  Smokeless Tobacco Never    Current Outpatient Medications on File Prior to Visit  Medication Sig Dispense Refill   B-D ULTRAFINE III SHORT PEN 31G X 8 MM MISC Inject into the skin 3 (three) times daily.     Continuous Glucose Sensor (FREESTYLE LIBRE 3 SENSOR) MISC Place 1 sensor on the skin every 14 days. Use to check glucose continuously 2 each 11   ONETOUCH VERIO test strip Apply 1 strip topically daily.  0   tirzepatide (MOUNJARO) 10 MG/0.5ML Pen Inject 10 mg into the skin once a week. 2 mL 0   Vitamin D, Ergocalciferol, (DRISDOL) 1.25 MG (50000 UNIT) CAPS capsule Take 1 capsule (50,000 Units total) by mouth once a week. 13 capsule 1    No current facility-administered medications on file prior to visit.    ROS see history of present illness  Objective  Physical Exam Vitals:   06/16/23 1011  BP: 124/88  Pulse: 67  Temp: (!) 97.5 F (36.4 C)  SpO2: 98%    BP Readings from Last 3 Encounters:  06/16/23 124/88  04/07/23 118/82  02/24/23 128/86   Wt Readings from Last 3 Encounters:  06/16/23 200 lb 3.2 oz (90.8 kg)  04/07/23 201 lb 9.6 oz (91.4 kg)  02/24/23 202 lb 9.6 oz (91.9 kg)    Physical Exam Constitutional:      General: She is not in acute distress.    Appearance: Normal appearance.  HENT:     Head: Normocephalic.  Cardiovascular:     Rate and Rhythm: Normal rate and regular rhythm.     Heart sounds: Normal heart sounds.  Pulmonary:     Effort: Pulmonary effort is normal.     Breath sounds: Normal breath sounds.  Skin:    General: Skin is warm and dry.  Neurological:     General: No focal deficit present.     Mental Status: She is alert.  Psychiatric:        Mood and Affect: Mood normal.        Behavior: Behavior normal.    Assessment/Plan: Please see individual problem list.  Type 2 diabetes mellitus with  hyperglycemia, without long-term current use of insulin (HCC) Assessment & Plan: Glycemic control has significantly improved, with A1c reduced from 10.9 to 5.7. Currently taking Mounjaro 10mg  weekly, Glipizide daily, and Toujeo 20 units daily. Hypoglycemia reported with Toujeo, so discontinue its use due to hypoglycemia risk and improved A1c. She will also discontinue Glipizide. She will continue to monitor her glucose. Continue Mounjaro 10mg  weekly for 2 more weeks then increase to 12.5 mg weekly. Continue healthy diet and exercise.  Orders: -     POCT glycosylated hemoglobin (Hb A1C) -     Tirzepatide; Inject 12.5 mg into the skin once a week.  Dispense: 6 mL; Refill: 1  Hyperlipidemia associated with type 2 diabetes mellitus (HCC) Assessment & Plan: Not on lipid-lowering  therapy. Last triglycerides were elevated, likely due to previously uncontrolled diabetes. Recheck lipid panel at the next appointment in 3 months. Encourage healthy diet and exercise.    Obesity (BMI 30-39.9) Assessment & Plan: Reports decreased appetite and has started exercising 30 minutes three times a week. Encourage continued exercise and increased protein intake. Consider protein supplements such as shakes or bars.   Orders: -     Tirzepatide; Inject 12.5 mg into the skin once a week.  Dispense: 6 mL; Refill: 1    Return in about 3 months (around 09/13/2023) for Follow up.   Bethanie Dicker, NP-C [facility] - [facility]

## 2023-06-17 ENCOUNTER — Other Ambulatory Visit (HOSPITAL_COMMUNITY): Payer: Self-pay

## 2023-06-17 NOTE — Telephone Encounter (Signed)
 PA request has been resolved in separate encounter, please sign off on rx in this encounter as PA team is unable to resolve RX requests. Thank you

## 2023-07-07 ENCOUNTER — Ambulatory Visit: Payer: Self-pay | Admitting: Internal Medicine

## 2023-09-11 ENCOUNTER — Other Ambulatory Visit: Payer: Self-pay | Admitting: Nurse Practitioner

## 2023-09-11 DIAGNOSIS — E559 Vitamin D deficiency, unspecified: Secondary | ICD-10-CM

## 2023-09-17 ENCOUNTER — Ambulatory Visit: Payer: 59 | Admitting: Nurse Practitioner

## 2023-09-23 ENCOUNTER — Ambulatory Visit: Admitting: Nurse Practitioner

## 2023-09-23 ENCOUNTER — Encounter: Payer: Self-pay | Admitting: Nurse Practitioner

## 2023-09-23 VITALS — BP 112/70 | HR 64 | Temp 97.9°F | Ht 67.0 in | Wt 189.4 lb

## 2023-09-23 DIAGNOSIS — E785 Hyperlipidemia, unspecified: Secondary | ICD-10-CM

## 2023-09-23 DIAGNOSIS — E1169 Type 2 diabetes mellitus with other specified complication: Secondary | ICD-10-CM

## 2023-09-23 DIAGNOSIS — E1165 Type 2 diabetes mellitus with hyperglycemia: Secondary | ICD-10-CM

## 2023-09-23 DIAGNOSIS — L853 Xerosis cutis: Secondary | ICD-10-CM | POA: Insufficient documentation

## 2023-09-23 DIAGNOSIS — E559 Vitamin D deficiency, unspecified: Secondary | ICD-10-CM

## 2023-09-23 DIAGNOSIS — M25551 Pain in right hip: Secondary | ICD-10-CM | POA: Diagnosis not present

## 2023-09-23 DIAGNOSIS — Z7985 Long-term (current) use of injectable non-insulin antidiabetic drugs: Secondary | ICD-10-CM

## 2023-09-23 LAB — BASIC METABOLIC PANEL WITH GFR
BUN: 9 mg/dL (ref 6–23)
CO2: 29 meq/L (ref 19–32)
Calcium: 9.2 mg/dL (ref 8.4–10.5)
Chloride: 103 meq/L (ref 96–112)
Creatinine, Ser: 0.66 mg/dL (ref 0.40–1.20)
GFR: 101.11 mL/min (ref >60.00)
Glucose, Bld: 134 mg/dL — ABNORMAL HIGH (ref 70–99)
Potassium: 3.9 meq/L (ref 3.5–5.1)
Sodium: 139 meq/L (ref 135–145)

## 2023-09-23 LAB — LIPID PANEL
Cholesterol: 166 mg/dL (ref 0–200)
HDL: 54.5 mg/dL (ref >39.00)
LDL Cholesterol: 89 mg/dL (ref 0–99)
NonHDL: 111.12
Total CHOL/HDL Ratio: 3
Triglycerides: 111 mg/dL (ref 0.0–149.0)
VLDL: 22.2 mg/dL (ref 0.0–40.0)

## 2023-09-23 LAB — VITAMIN D 25 HYDROXY (VIT D DEFICIENCY, FRACTURES): VITD: 33.41 ng/mL (ref 30.00–100.00)

## 2023-09-23 LAB — HEMOGLOBIN A1C: Hgb A1c MFr Bld: 5.6 % (ref 4.6–6.5)

## 2023-09-23 NOTE — Progress Notes (Signed)
 Alice Burkitt, NP-C Phone: [phone]  Alice Rodriguez is a 53 y.o. female who presents today for follow up.   Discussed the use of AI scribe software for clinical note transcription with the patient, who gave verbal consent to proceed.  History of Present Illness   Alice Rodriguez is a 53 year old female who presents with nighttime hip pain and skin peeling.  She experiences persistent, bilateral, nagging hip pain when lying on one side for extended periods at night. Pain is worse on the right than the left. The pain is not sharp, and she has not attempted any treatments such as anti-inflammatories or ice and heat. No problems with walking or pain during activities other than lying down.  She has noticed peeling skin on her feet, legs, and arms after spending a week at her brother-in-law's house in [place] . She suspects the water quality there may have contributed to her skin drying out. She regularly applies lotion and exfoliates but has not been exposed to the sun.  She is on Mounjaro, which she believes is affecting her appetite, leading to an 11-pound weight loss over the past three months. Her diet includes protein shakes, sandwiches, crab cakes, fish, chicken, and vegetables. She has recently started line dancing at a local studio multiple times a week, contributing to her physical activity.  Her blood sugar levels are monitored with a sensor, showing an average of 135, with no episodes of hypoglycemia. No excessive thirst or urination.  She has been on a high-dose vitamin D  supplement once a week for years, with her last level being 27, slightly below the normal range.      Social History   Tobacco Use  Smoking Status Never  Smokeless Tobacco Never    Current Outpatient Medications on File Prior to Visit  Medication Sig Dispense Refill   B-D ULTRAFINE III SHORT PEN 31G X 8 MM MISC Inject into the skin 3 (three) times daily.     Continuous Glucose Sensor (FREESTYLE LIBRE 3  SENSOR) MISC Place 1 sensor on the skin every 14 days. Use to check glucose continuously 2 each 11   ONETOUCH VERIO test strip Apply 1 strip topically daily.  0   tirzepatide (MOUNJARO) 12.5 MG/0.5ML Pen Inject 12.5 mg into the skin once a week. 6 mL 1   Vitamin D , Ergocalciferol , (DRISDOL ) 1.25 MG (50000 UNIT) CAPS capsule Take 1 capsule (50,000 Units total) by mouth once a week. 13 capsule 1   No current facility-administered medications on file prior to visit.     ROS see history of present illness  Objective  Physical Exam Vitals:   09/23/23 0937  BP: 112/70  Pulse: 64  Temp: 97.9 F (36.6 C)    BP Readings from Last 3 Encounters:  09/23/23 112/70  06/16/23 124/88  04/07/23 118/82   Wt Readings from Last 3 Encounters:  09/23/23 189 lb 6.4 oz (85.9 kg)  06/16/23 200 lb 3.2 oz (90.8 kg)  04/07/23 201 lb 9.6 oz (91.4 kg)    Physical Exam Constitutional:      General: She is not in acute distress.    Appearance: Normal appearance.  HENT:     Head: Normocephalic.  Cardiovascular:     Rate and Rhythm: Normal rate and regular rhythm.     Heart sounds: Normal heart sounds.  Pulmonary:     Effort: Pulmonary effort is normal.     Breath sounds: Normal breath sounds.  Skin:    General: Skin is warm  and dry.  Neurological:     General: No focal deficit present.     Mental Status: She is alert.  Psychiatric:        Mood and Affect: Mood normal.        Behavior: Behavior normal.      Assessment/Plan: Please see individual problem list.  Type 2 diabetes mellitus with hyperglycemia, without long-term current use of insulin (HCC) Assessment & Plan: Type 2 diabetes is managed with Mounjaro 12.5 mg weekly, resulting in an 11-pound weight loss over three months. Blood sugar levels are well-controlled with no hypoglycemia reported, indicating effective current management. Continue Mounjaro at 12.5 mg. Check A1c and BMP. Encourage a high protein diet, adequate hydration,  and physical activity through line dancing. Consider increasing the Mounjaro dose if A1c rises.  Orders: -     Basic metabolic panel with GFR -     Hemoglobin A1c  Dry skin dermatitis Assessment & Plan: Peeling is possibly due to water quality changes or exfoliation practices. She is advised against exfoliating. Advise moisturizing the skin thoroughly. Monitor the condition and report if it persists despite regular moisturizing.    Right hip pain Assessment & Plan: Intermittent hip pain is likely due to bursitis from pressure points. Normal ROM. No weakness. Trial ibuprofen or naproxen before bed. Consider x-rays or orthopedic referral if symptoms persist or worsen.   Vitamin D  deficiency Assessment & Plan: Vitamin D  deficiency is treated with high-dose weekly supplementation. The previous level was 27 ng/mL. Check current vitamin D  levels. Continue high-dose weekly vitamin D  if levels remain low. Consider transitioning to over-the-counter vitamin D  if levels normalize.  Orders: -     VITAMIN D  25 Hydroxy (Vit-D Deficiency, Fractures)  Hyperlipidemia associated with type 2 diabetes mellitus (HCC) Assessment & Plan: Not on lipid-lowering therapy. Currently diet controlled. Recheck lipid panel today. Encourage healthy diet and exercise.   Orders: -     Lipid panel     Return in about 3 months (around 12/24/2023) for Follow up.   Alice Burkitt, NP-C [facility] - [facility]

## 2023-09-23 NOTE — Assessment & Plan Note (Signed)
 Vitamin D  deficiency is treated with high-dose weekly supplementation. The previous level was 27 ng/mL. Check current vitamin D  levels. Continue high-dose weekly vitamin D  if levels remain low. Consider transitioning to over-the-counter vitamin D  if levels normalize.

## 2023-09-23 NOTE — Assessment & Plan Note (Addendum)
 Intermittent hip pain is likely due to bursitis from pressure points. Normal ROM. No weakness. Trial ibuprofen or naproxen before bed. Consider x-rays or orthopedic referral if symptoms persist or worsen.

## 2023-09-23 NOTE — Assessment & Plan Note (Signed)
 Not on lipid-lowering therapy. Currently diet controlled. Recheck lipid panel today. Encourage healthy diet and exercise.

## 2023-09-23 NOTE — Assessment & Plan Note (Signed)
 Type 2 diabetes is managed with Mounjaro 12.5 mg weekly, resulting in an 11-pound weight loss over three months. Blood sugar levels are well-controlled with no hypoglycemia reported, indicating effective current management. Continue Mounjaro at 12.5 mg. Check A1c and BMP. Encourage a high protein diet, adequate hydration, and physical activity through line dancing. Consider increasing the Mounjaro dose if A1c rises.

## 2023-09-23 NOTE — Assessment & Plan Note (Signed)
 Peeling is possibly due to water quality changes or exfoliation practices. She is advised against exfoliating. Advise moisturizing the skin thoroughly. Monitor the condition and report if it persists despite regular moisturizing.

## 2023-09-24 ENCOUNTER — Ambulatory Visit: Payer: Self-pay | Admitting: Nurse Practitioner

## 2023-12-14 ENCOUNTER — Other Ambulatory Visit: Payer: Self-pay | Admitting: Nurse Practitioner

## 2023-12-14 DIAGNOSIS — E1165 Type 2 diabetes mellitus with hyperglycemia: Secondary | ICD-10-CM

## 2023-12-14 DIAGNOSIS — E669 Obesity, unspecified: Secondary | ICD-10-CM

## 2023-12-29 ENCOUNTER — Encounter: Payer: Self-pay | Admitting: Nurse Practitioner

## 2023-12-29 ENCOUNTER — Ambulatory Visit (INDEPENDENT_AMBULATORY_CARE_PROVIDER_SITE_OTHER): Admitting: Nurse Practitioner

## 2023-12-29 VITALS — BP 124/82 | HR 67 | Temp 97.7°F | Ht 67.0 in | Wt 184.0 lb

## 2023-12-29 DIAGNOSIS — E1169 Type 2 diabetes mellitus with other specified complication: Secondary | ICD-10-CM

## 2023-12-29 DIAGNOSIS — Z7985 Long-term (current) use of injectable non-insulin antidiabetic drugs: Secondary | ICD-10-CM

## 2023-12-29 DIAGNOSIS — E785 Hyperlipidemia, unspecified: Secondary | ICD-10-CM | POA: Diagnosis not present

## 2023-12-29 DIAGNOSIS — E1165 Type 2 diabetes mellitus with hyperglycemia: Secondary | ICD-10-CM

## 2023-12-29 LAB — POCT GLYCOSYLATED HEMOGLOBIN (HGB A1C)
HbA1c POC (<> result, manual entry): 5.5 % (ref 4.0–5.6)
HbA1c, POC (controlled diabetic range): 5.5 % (ref 0.0–7.0)
HbA1c, POC (prediabetic range): 5.5 % — AB (ref 5.7–6.4)
Hemoglobin A1C: 5.5 % (ref 4.0–5.6)

## 2023-12-29 MED ORDER — FREESTYLE LIBRE 3 PLUS SENSOR MISC
11 refills | Status: AC
Start: 2023-12-29 — End: ?

## 2023-12-29 NOTE — Progress Notes (Signed)
 Leron Glance, NP-C Phone: [phone]  Alice Rodriguez is a 53 y.o. female who presents today for follow up.   Discussed the use of AI scribe software for clinical note transcription with the patient, who gave verbal consent to proceed.  History of Present Illness   Alice Rodriguez is a 53 year old female who presents for a three-month follow-up for diabetes management.  She has been unable to monitor her blood glucose levels recently due to her previous device requiring an upgrade to the [place] 3 Plus. Previously, her blood glucose levels ranged from 100 to 106 mg/dL. She denies excessive thirst, excessive urination, or hypoglycemia. She is currently on Mounjaro  12.5 mg weekly.  Her diet consists of protein shakes and one meal per day. She is uncertain about weight changes since her last visit but notes a previous A1c of 5.6% three months ago.  She is mindful of her cholesterol levels, with a previous LDL level of 89 mg/dL. She engages in physical activity by participating in line dancing, which has increased to three days a week from two.  Her mood, anxiety, and depression are reported as 'okay'.      Social History   Tobacco Use  Smoking Status Never  Smokeless Tobacco Never    Current Outpatient Medications on File Prior to Visit  Medication Sig Dispense Refill   B-D ULTRAFINE III SHORT PEN 31G X 8 MM MISC Inject into the skin 3 (three) times daily.     tirzepatide  (MOUNJARO ) 12.5 MG/0.5ML Pen INJECT 12.5 MG SUBCUTANEOUSLY ONE TIME PER WEEK 2 mL 1   Vitamin D , Ergocalciferol , (DRISDOL ) 1.25 MG (50000 UNIT) CAPS capsule Take 1 capsule (50,000 Units total) by mouth once a week. 13 capsule 1   ONETOUCH VERIO test strip Apply 1 strip topically daily.  0   No current facility-administered medications on file prior to visit.     ROS see history of present illness  Objective  Physical Exam Vitals:   12/29/23 0815  BP: 124/82  Pulse: 67  Temp: 97.7 F (36.5 C)  SpO2: 98%     BP Readings from Last 3 Encounters:  12/29/23 124/82  09/23/23 112/70  06/16/23 124/88   Wt Readings from Last 3 Encounters:  12/29/23 184 lb (83.5 kg)  09/23/23 189 lb 6.4 oz (85.9 kg)  06/16/23 200 lb 3.2 oz (90.8 kg)    Physical Exam Constitutional:      General: She is not in acute distress.    Appearance: Normal appearance.  HENT:     Head: Normocephalic.  Cardiovascular:     Rate and Rhythm: Normal rate and regular rhythm.     Heart sounds: Normal heart sounds.  Pulmonary:     Effort: Pulmonary effort is normal.     Breath sounds: Normal breath sounds.  Skin:    General: Skin is warm and dry.  Neurological:     General: No focal deficit present.     Mental Status: She is alert.  Psychiatric:        Mood and Affect: Mood normal.        Behavior: Behavior normal.      Assessment/Plan: Please see individual problem list.  Type 2 diabetes mellitus with hyperglycemia, without long-term current use of insulin (HCC) Assessment & Plan: Type 2 diabetes is well-controlled with a POCT A1c of 5.5% today. She has effectively managed her weight, losing 16 pounds over six months. Prescribe Libre 3 Plus for continuous glucose monitoring. Continue Mounjaro  12.5 mg  weekly. Encourage healthy diet and regular exercise.   Orders: -     FreeStyle Libre 3 Plus Sensor; Use as directed to monitor blood glucose. Change sensor every 15 days.  Dispense: 2 each; Refill: 11 -     POCT glycosylated hemoglobin (Hb A1C)  Hyperlipidemia associated with type 2 diabetes mellitus (HCC) Assessment & Plan: Hyperlipidemia is adequately managed with diet and exercise with an LDL of 89 mg/dL. Not on any medication. Encourage continued diet and exercise to maintain LDL control.       Return in about 6 months (around 06/27/2024) for Follow up.   Leron Glance, NP-C [facility]

## 2024-01-13 ENCOUNTER — Encounter: Payer: Self-pay | Admitting: Nurse Practitioner

## 2024-01-13 NOTE — Assessment & Plan Note (Signed)
 Type 2 diabetes is well-controlled with a POCT A1c of 5.5% today. She has effectively managed her weight, losing 16 pounds over six months. Prescribe Libre 3 Plus for continuous glucose monitoring. Continue Mounjaro  12.5 mg weekly. Encourage healthy diet and regular exercise.

## 2024-01-13 NOTE — Assessment & Plan Note (Signed)
 Hyperlipidemia is adequately managed with diet and exercise with an LDL of 89 mg/dL. Not on any medication. Encourage continued diet and exercise to maintain LDL control.

## 2024-01-28 ENCOUNTER — Ambulatory Visit: Payer: Self-pay

## 2024-01-28 ENCOUNTER — Ambulatory Visit

## 2024-01-28 VITALS — BP 104/66 | HR 71 | Temp 98.7°F | Ht 67.0 in | Wt 178.2 lb

## 2024-01-28 DIAGNOSIS — R10A2 Flank pain, left side: Secondary | ICD-10-CM | POA: Insufficient documentation

## 2024-01-28 DIAGNOSIS — F4321 Adjustment disorder with depressed mood: Secondary | ICD-10-CM | POA: Insufficient documentation

## 2024-01-28 DIAGNOSIS — N3000 Acute cystitis without hematuria: Secondary | ICD-10-CM | POA: Insufficient documentation

## 2024-01-28 LAB — URINALYSIS, ROUTINE W REFLEX MICROSCOPIC
Bilirubin Urine: NEGATIVE
Hgb urine dipstick: NEGATIVE
Ketones, ur: NEGATIVE
Nitrite: POSITIVE — AB
Specific Gravity, Urine: 1.02 (ref 1.000–1.030)
Urine Glucose: NEGATIVE
Urobilinogen, UA: 0.2 (ref 0.0–1.0)
pH: 6 (ref 5.0–8.0)

## 2024-01-28 LAB — COMPREHENSIVE METABOLIC PANEL WITH GFR
ALT: 15 U/L (ref 0–35)
AST: 16 U/L (ref 0–37)
Albumin: 3.8 g/dL (ref 3.5–5.2)
Alkaline Phosphatase: 48 U/L (ref 39–117)
BUN: 6 mg/dL (ref 6–23)
CO2: 32 meq/L (ref 19–32)
Calcium: 9.1 mg/dL (ref 8.4–10.5)
Chloride: 101 meq/L (ref 96–112)
Creatinine, Ser: 0.57 mg/dL (ref 0.40–1.20)
GFR: 104.5 mL/min (ref >60.00)
Glucose, Bld: 133 mg/dL — ABNORMAL HIGH (ref 70–99)
Potassium: 3.6 meq/L (ref 3.5–5.1)
Sodium: 139 meq/L (ref 135–145)
Total Bilirubin: 0.5 mg/dL (ref 0.2–1.2)
Total Protein: 7.2 g/dL (ref 6.0–8.3)

## 2024-01-28 LAB — CBC WITH DIFFERENTIAL/PLATELET
Basophils Absolute: 0 K/uL (ref 0.0–0.1)
Basophils Relative: 0.2 % (ref 0.0–3.0)
Eosinophils Absolute: 0.2 K/uL (ref 0.0–0.7)
Eosinophils Relative: 2.7 % (ref 0.0–5.0)
HCT: 37.8 % (ref 36.0–46.0)
Hemoglobin: 12.6 g/dL (ref 12.0–15.0)
Lymphocytes Relative: 30.2 % (ref 12.0–46.0)
Lymphs Abs: 2.2 K/uL (ref 0.7–4.0)
MCHC: 33.3 g/dL (ref 30.0–36.0)
MCV: 91.2 fl (ref 78.0–100.0)
Monocytes Absolute: 0.5 K/uL (ref 0.1–1.0)
Monocytes Relative: 7.1 % (ref 3.0–12.0)
Neutro Abs: 4.4 K/uL (ref 1.4–7.7)
Neutrophils Relative %: 59.8 % (ref 43.0–77.0)
Platelets: 308 K/uL (ref 150.0–400.0)
RBC: 4.14 Mil/uL (ref 3.87–5.11)
RDW: 12.6 % (ref 11.5–15.5)
WBC: 7.4 K/uL (ref 4.0–10.5)

## 2024-01-28 LAB — LIPASE: Lipase: 25 U/L (ref 11.0–59.0)

## 2024-01-28 MED ORDER — NITROFURANTOIN MONOHYD MACRO 100 MG PO CAPS: 100.0000 mg | ORAL_CAPSULE | Freq: Two times a day (BID) | ORAL | 0 refills | Status: DC

## 2024-01-28 MED ORDER — TIZANIDINE HCL 2 MG PO TABS: 2.0000 mg | ORAL_TABLET | Freq: Three times a day (TID) | ORAL | 0 refills | Status: DC | PRN

## 2024-01-28 MED ORDER — DICLOFENAC SODIUM 1 % EX GEL: 2.0000 g | Freq: Four times a day (QID) | CUTANEOUS | 0 refills | Status: DC | PRN

## 2024-01-28 NOTE — Patient Instructions (Addendum)
 Apply Diclofenac gel 1% up to 4 times a day on left mid back. Try ice/heat and avoid activities triggering pain.  Take Tizanidine 2 mg every 8 hourly as needed for muscle spasm.  Once I have your results back, if there is no sign of infection or blood in urine I recommend 2-4 weeks course of antiinflammatory medication to help with pain. I also recommend taking acid reducing medication for the same course when you take NSAIDS. Please hold off on all over the counter NSAIDs  (Advil ,Motrin, Aleve).

## 2024-01-28 NOTE — Assessment & Plan Note (Signed)
 Grief reaction following recent spouse death from prostate cancer on 11-Feb-2024. Experiencing difficulty with appetite and emotional distress. Has support from family, friends, and church community. Recommend grief counseling, patient politely declines at this time. She is expected to go back to work at [employer] on 02/01/24 but due to ongoing grief and emotional impact she is looking into FMLA. Recommend patient drop this off and I am happy to help her with this. I also recommend follow-up appointment in 4-6 weeks with her PCP to assess coping with grief and consider need for counseling or medication.

## 2024-01-28 NOTE — Assessment & Plan Note (Signed)
 Differential includes muscle spasm,  pancreatitis, kidney stones, Takotsubo cardiomyopathy, GERD Pain responsive to NSAID response but does not fully resolve.  Check CBC, CMP, Lipase, UA.  Prescribe Tizanidine 2 mg every 8 hours as needed for muscle spasm. Recommend application of diclofenac gel 1% up to four times a day on the left mid-back. Advise use of ice and heat therapy on the affected area. Consider physical therapy if pain does not improve and lab work is unremarkable. Consider long acting prescription strength NSAIS like Meloxicam 15 mg daily with PPI for 4 weeks. Recommend holding off on OTC NSAIDs for now.

## 2024-01-28 NOTE — Progress Notes (Signed)
 Please let the patient know her electrolytes, kidney function, electrolytes looks stable. CBC is normal. However, urine is suggestive of urinary tract infection. Recommend treatment with antibiotic called Macrobid 100 mg twice daily for 5 days. I also recommend increasing water intake to about 40 oz per day. Prescription sent to the pharmacy.   Thank you, Luke Shade, MD

## 2024-01-28 NOTE — Progress Notes (Signed)
 Acute Office Visit  Subjective:    Patient ID: Alice Rodriguez, female    DOB: 03/13/1971, 53 y.o.   MRN: [id number]  Chief Complaint  Patient presents with   Flank Pain   Patient is in today for following acute concerns: HPI Discussed the use of AI scribe software for clinical note transcription with the patient, who gave verbal consent to proceed.  History of Present Illness Alice Rodriguez is a 53 year old female who presents with persistent left-sided back and flank pain.   She has been experiencing persistent left-sided back and flank pain for about two weeks. The pain is described as a 'nagging' sensation that is constant and worsens when laying down on left side and changing positions. She has been taking Advil 500 mg every six hours, which provides some relief, but the pain returns if she does not continue the medication. She has not tried using a heating or cooling pad for relief.  No recent muscle strain, jerky movements, or falls. No history of kidney stones. No fever, chills, vomiting, urinary urgency, frequency, diarrhea, constipation, or inflammatory bowel disease. The pain does not radiate down her legs. She reports some discomfort when taking deep breaths.  She is currently on Mounjaro , with her dose increased to 12.5 mg several months ago. She administers the injection on Sundays and experiences nausea and bloating on the day of the injection. She reports a decreased appetite, which she attributes to the Mounjaro , and is not eating three meals a day. She does not consume alcohol and denies any burping or pain under the rib cage.  Her husband passed away from prostate cancer on January 17, 2024, and she is experiencing grief following his recent death. She is expected to go back to work at [employer] on 02/01/24 but due to grief she is looking into FMLA. No thoughts of self harm.  She is religious and has support from her church, friends, and family. Her children are grown and  live in different cities, but she has a friend staying with her at night.    ROS As per HPI    Objective:    BP 104/66 (BP Location: Right Arm, Patient Position: Sitting, Cuff Size: Normal)   Pulse 71   Temp 98.7 F (37.1 C) (Oral)   Ht 5' 7 (1.702 m)   Wt 178 lb 3.2 oz (80.8 kg)   SpO2 94%   BMI 27.91 kg/m    Physical Exam Constitutional:      General: She is not in acute distress. HENT:     Head: Normocephalic and atraumatic.     Mouth/Throat:     Mouth: Mucous membranes are moist.  Cardiovascular:     Rate and Rhythm: Normal rate.     Heart sounds: No murmur heard. Pulmonary:     Effort: Pulmonary effort is normal.     Breath sounds: Normal breath sounds. No wheezing.  Abdominal:     General: Bowel sounds are normal. There is no distension.     Palpations: Abdomen is soft.     Tenderness: There is no abdominal tenderness. There is no guarding or rebound.  Musculoskeletal:     Cervical back: Neck supple.     Thoracic back: Spasms present. No lacerations.     Lumbar back: Negative right straight leg raise test and negative left straight leg raise test.     Right lower leg: No edema.     Left lower leg: No edema.  Comments: No deformity, skin rash on exam of back. On palpation, she has focal tenderness along left thoracic paraspinal muscle and tenderness on palpation over muscle group.   Lymphadenopathy:     Cervical: No cervical adenopathy.  Skin:    General: Skin is warm.  Neurological:     Mental Status: She is alert and oriented to person, place, and time.  Psychiatric:        Mood and Affect: Affect is tearful.        Behavior: Behavior is cooperative.     No results found for any visits on 01/28/24.     Assessment & Plan:  Left flank pain Assessment & Plan: Differential includes muscle spasm,  pancreatitis, kidney stones, Takotsubo cardiomyopathy, GERD Pain responsive to NSAID response but does not fully resolve.  Check CBC, CMP, Lipase, UA.   Prescribe Tizanidine 2 mg every 8 hours as needed for muscle spasm. Recommend application of diclofenac gel 1% up to four times a day on the left mid-back. Advise use of ice and heat therapy on the affected area. Consider physical therapy if pain does not improve and lab work is unremarkable. Consider long acting prescription strength NSAIS like Meloxicam 15 mg daily with PPI for 4 weeks. Recommend holding off on OTC NSAIDs for now.    Orders: -     Comprehensive metabolic panel with GFR -     CBC with Differential/Platelet -     Lipase -     Urinalysis, Routine w reflex microscopic -     Diclofenac Sodium; Apply 2 g topically 4 (four) times daily as needed.  Dispense: 150 g; Refill: 0  Grief Assessment & Plan: Grief reaction following recent spouse death from prostate cancer on 02-10-24. Experiencing difficulty with appetite and emotional distress. Has support from family, friends, and church community. Recommend grief counseling, patient politely declines at this time. She is expected to go back to work at [employer] on 02/01/24 but due to ongoing grief and emotional impact she is looking into FMLA. Recommend patient drop this off and I am happy to help her with this. I also recommend follow-up appointment in 4-6 weeks with her PCP to assess coping with grief and consider need for counseling or medication.     Other orders -     tiZANidine HCl; Take 1 tablet (2 mg total) by mouth every 8 (eight) hours as needed for muscle spasms.  Dispense: 45 tablet; Refill: 0    Return in about 4 weeks (around 02/25/2024) for 4-6 weeks for chronic follow up PCP. SABRA  Luke Shade, MD

## 2024-02-01 NOTE — Progress Notes (Signed)
 Pt notified and viewed via MyChart.

## 2024-02-04 ENCOUNTER — Telehealth: Payer: Self-pay

## 2024-02-04 ENCOUNTER — Other Ambulatory Visit: Payer: Self-pay

## 2024-02-04 NOTE — Telephone Encounter (Signed)
 Short term disability forms placed in provider to be signed folder

## 2024-02-10 ENCOUNTER — Other Ambulatory Visit (HOSPITAL_COMMUNITY): Payer: Self-pay

## 2024-02-11 NOTE — Telephone Encounter (Signed)
 Pt called back to follow-up. She stated that the forms are due today 10/23. Please call and advise.

## 2024-02-11 NOTE — Telephone Encounter (Signed)
 Spoke with representative from [employer] notified them that Alice Rodriguez is on vacation this week and will not return until 10/28. They stated that date will be find to send it back.  However, when I spoke to the patient to infirm her, she stated that Dr Abbey is the one that suggested that she fills this paperwork out.  So I am forwarding this message and paperwork to Dr Abbey.  I also notified the patient that Dr Abbey is also off this week & will not return until Monday.

## 2024-02-11 NOTE — Telephone Encounter (Signed)
 noted

## 2024-02-15 NOTE — Telephone Encounter (Signed)
 Pt came to pick these up today

## 2024-02-15 NOTE — Telephone Encounter (Signed)
 Forms have been completed by Dr Abbey, MD and faxed back over to [facility]. Patient was notified via MyChart that a copy has been placed up front as well for her to pick up at her convenience.

## 2024-02-15 NOTE — Telephone Encounter (Signed)
 Form filled out with date unable to work from 02/01/24 till 03/10/24, return to work 03/11/2024. She will need follow up with her PCP Leron Glance, NP for further evaluation if date needs to be extended. Patient can either come pick up the form or we can fax this to [facility] financial.   Thank you, Luke Shade, MD

## 2024-02-18 ENCOUNTER — Other Ambulatory Visit (HOSPITAL_COMMUNITY): Payer: Self-pay

## 2024-02-18 ENCOUNTER — Other Ambulatory Visit: Payer: Self-pay | Admitting: Nurse Practitioner

## 2024-02-18 DIAGNOSIS — Z1231 Encounter for screening mammogram for malignant neoplasm of breast: Secondary | ICD-10-CM

## 2024-02-22 ENCOUNTER — Ambulatory Visit
Admission: RE | Admit: 2024-02-22 | Discharge: 2024-02-22 | Disposition: A | Payer: 59 | Source: Ambulatory Visit | Attending: Nurse Practitioner | Admitting: Nurse Practitioner

## 2024-02-22 DIAGNOSIS — Z1231 Encounter for screening mammogram for malignant neoplasm of breast: Secondary | ICD-10-CM

## 2024-02-25 ENCOUNTER — Ambulatory Visit: Payer: Self-pay | Admitting: Nurse Practitioner

## 2024-03-04 ENCOUNTER — Other Ambulatory Visit: Payer: Self-pay | Admitting: Nurse Practitioner

## 2024-03-04 DIAGNOSIS — E669 Obesity, unspecified: Secondary | ICD-10-CM

## 2024-03-04 DIAGNOSIS — E1165 Type 2 diabetes mellitus with hyperglycemia: Secondary | ICD-10-CM

## 2024-03-09 ENCOUNTER — Other Ambulatory Visit: Payer: Self-pay | Admitting: Nurse Practitioner

## 2024-03-09 DIAGNOSIS — E1165 Type 2 diabetes mellitus with hyperglycemia: Secondary | ICD-10-CM

## 2024-03-14 ENCOUNTER — Ambulatory Visit: Admitting: Nurse Practitioner

## 2024-03-14 ENCOUNTER — Telehealth: Payer: Self-pay

## 2024-03-14 VITALS — BP 110/78 | HR 79 | Temp 97.6°F | Ht 67.0 in | Wt 175.8 lb

## 2024-03-14 DIAGNOSIS — F4321 Adjustment disorder with depressed mood: Secondary | ICD-10-CM

## 2024-03-14 DIAGNOSIS — E1165 Type 2 diabetes mellitus with hyperglycemia: Secondary | ICD-10-CM

## 2024-03-14 DIAGNOSIS — F5104 Psychophysiologic insomnia: Secondary | ICD-10-CM | POA: Insufficient documentation

## 2024-03-14 MED ORDER — SERTRALINE HCL 50 MG PO TABS: 50.0000 mg | ORAL_TABLET | Freq: Every day | ORAL | 0 refills | Status: DC

## 2024-03-14 NOTE — Telephone Encounter (Signed)
 FMLA form signed and faxed while patient was in office 03/14/2024.

## 2024-03-14 NOTE — Progress Notes (Signed)
 Leron Glance, NP-C Phone: [phone]  Alice Rodriguez is a 53 y.o. female who presents today for FMLA and grief.   Discussed the use of AI scribe software for clinical note transcription with the patient, who gave verbal consent to proceed.  History of Present Illness   Alice Rodriguez is a 53 year old female who presents with sleep disturbances and FMLA paperwork after the recent passing of her husband.  She has been experiencing significant difficulty sleeping at night, achieving only three to four hours of sleep. She has been taking melatonin, four tablets of three mg each, but it has not been effective in helping her fall asleep. She takes the melatonin right before bed.  Her husband passed away at the end of 01-18-24, and she was initially supposed to return to work two weeks later. However, she has not returned to work since then and is currently on FMLA, which was initially set to end today. She is seeking an extension of her FMLA due to ongoing difficulties adjusting to staying at home and managing her grief.  She describes her mood as fluctuating, with feelings of depression being more prominent than anxiety. She finds it hard to start her days and lacks energy. She has never been on antidepressants or had issues with anxiety or depression before. She is currently taking one-a-day vitamins.  Her daughter recently moved out of her apartment for a job in [place], which has been an additional stressor. She acknowledges the difficulty of the holiday season, especially as it is the first without her husband.  She is currently using Mounjaro  for diabetes management, and her last A1c was 5.5. No excessive thirst, urination, or low blood sugars.      Social History   Tobacco Use  Smoking Status Never  Smokeless Tobacco Never    Current Outpatient Medications on File Prior to Visit  Medication Sig Dispense Refill   Continuous Glucose Sensor (FREESTYLE LIBRE 3 PLUS SENSOR) MISC Use as  directed to monitor blood glucose. Change sensor every 15 days. 2 each 11   tirzepatide  (MOUNJARO ) 12.5 MG/0.5ML Pen INJECT 12.5 MG SUBCUTANEOUSLY ONE TIME PER WEEK 6 mL 1   Vitamin D , Ergocalciferol , (DRISDOL ) 1.25 MG (50000 UNIT) CAPS capsule Take 1 capsule (50,000 Units total) by mouth once a week. 13 capsule 1   tiZANidine  (ZANAFLEX ) 2 MG tablet TAKE 1 TABLET BY MOUTH EVERY 8 HOURS AS NEEDED FOR MUSCLE SPASMS. 270 tablet 1   No current facility-administered medications on file prior to visit.     ROS see history of present illness  Objective  Physical Exam Vitals:   03/14/24 1027  BP: 110/78  Pulse: 79  Temp: 97.6 F (36.4 C)  SpO2: 97%    BP Readings from Last 3 Encounters:  03/14/24 110/78  01/28/24 104/66  12/29/23 124/82   Wt Readings from Last 3 Encounters:  03/14/24 175 lb 12.8 oz (79.7 kg)  01/28/24 178 lb 3.2 oz (80.8 kg)  12/29/23 184 lb (83.5 kg)    Physical Exam Constitutional:      General: She is not in acute distress.    Appearance: Normal appearance.  HENT:     Head: Normocephalic.  Cardiovascular:     Rate and Rhythm: Normal rate and regular rhythm.     Heart sounds: Normal heart sounds.  Pulmonary:     Effort: Pulmonary effort is normal.     Breath sounds: Normal breath sounds.  Skin:    General: Skin is warm and  dry.  Neurological:     General: No focal deficit present.     Mental Status: She is alert.  Psychiatric:        Mood and Affect: Mood normal. Affect is tearful.        Behavior: Behavior normal.      Assessment/Plan: Please see individual problem list.  Grief Assessment & Plan: She is experiencing depression and insomnia following significant life changes, with no prior history of anxiety or depression. Melatonin has been ineffective for sleep. The decision is to address depression first to potentially improve sleep. Started sertraline  (Zoloft ) at bedtime. Counseled patient on common side effects. Encouraged to contact if  worsening symptoms, unusual behavior changes or suicidal thoughts occur. Advised trial of melatonin two hours before bed. We will continue to monitor. FMLA paperwork completed with extension. Follow up in 6 weeks.   Orders: -     Sertraline  HCl; Take 1 tablet (50 mg total) by mouth at bedtime.  Dispense: 90 tablet; Refill: 0  Psychophysiological insomnia Assessment & Plan: See plan for grief. Starting Zoloft  50 mg daily at bedtime. Trial Melatonin two hours prior to bed. Counseled on sleep hygiene. We will continue to monitor.    Type 2 diabetes mellitus with hyperglycemia, without long-term current use of insulin (HCC) Assessment & Plan: Her type 2 diabetes is well-controlled with an A1c of 5.5, with no issues of excessive thirst, urination, or hypoglycemia. Although a continuous glucose monitoring sensor is available, it is not in use. Continue Mounjaro  12.5 mg weekly for diabetes management and monitor A1c levels. Use the continuous glucose monitoring sensor as needed. Encourage healthy diet and regular exercise.       Return in about 6 weeks (around 04/25/2024) for Anxiety/Depression.   Leron Glance, NP-C [facility]

## 2024-03-16 ENCOUNTER — Telehealth: Payer: Self-pay

## 2024-03-16 NOTE — Telephone Encounter (Signed)
 Disability form placed in provider folder

## 2024-03-16 NOTE — Telephone Encounter (Signed)
 Forms scanned to medical records

## 2024-03-22 NOTE — Telephone Encounter (Signed)
 Called and informed pt that the forms were scanned to medical records and provided her their phone number to check on the status of those forms [phone]

## 2024-03-22 NOTE — Telephone Encounter (Unsigned)
 Copied from CRM #[id number]. Topic: Medical Record Request - Other >> Mar 22, 2024  8:40 AM Mia F wrote: Reason for CRM: Patient has been out on FMLA. NP Leron was supposed to fill out paperwork and send them over to the [employer]. According to notes, that was faxed while pt was in office.   Patient is also following up on the additional paperwork that was sent. According to notes, it was sent to medical records. Please contact pt with a status

## 2024-03-29 NOTE — Telephone Encounter (Unsigned)
 Copied from CRM #[id number]. Topic: Medical Record Request - Other >> Mar 22, 2024  8:40 AM Mia F wrote: Reason for CRM: Patient has been out on FMLA. NP Leron was supposed to fill out paperwork and send them over to the [employer]. According to notes, that was faxed while pt was in office.   Patient is also following up on the additional paperwork that was sent. According to notes, it was sent to medical records. Please contact pt with a status >> Mar 29, 2024  4:13 PM Rosina BIRCH wrote: Patient called checking on medical records being faxed. I did let the patient know that it was faxed today again. Patient want to know if it is from October 2025 to present. Patient stated the answer can be left in mychart [phone]

## 2024-03-29 NOTE — Telephone Encounter (Signed)
 Forms received via e fax again two office notes have been faxed to the Short Term disability group as Medical Records has not done so.

## 2024-04-01 ENCOUNTER — Encounter: Payer: Self-pay | Admitting: Nurse Practitioner

## 2024-04-01 NOTE — Assessment & Plan Note (Signed)
 See plan for grief. Starting Zoloft  50 mg daily at bedtime. Trial Melatonin two hours prior to bed. Counseled on sleep hygiene. We will continue to monitor.

## 2024-04-01 NOTE — Assessment & Plan Note (Signed)
 Her type 2 diabetes is well-controlled with an A1c of 5.5, with no issues of excessive thirst, urination, or hypoglycemia. Although a continuous glucose monitoring sensor is available, it is not in use. Continue Mounjaro  12.5 mg weekly for diabetes management and monitor A1c levels. Use the continuous glucose monitoring sensor as needed. Encourage healthy diet and regular exercise.

## 2024-04-01 NOTE — Assessment & Plan Note (Addendum)
 She is experiencing depression and insomnia following significant life changes, with no prior history of anxiety or depression. Melatonin has been ineffective for sleep. The decision is to address depression first to potentially improve sleep. Started sertraline  (Zoloft ) at bedtime. Counseled patient on common side effects. Encouraged to contact if worsening symptoms, unusual behavior changes or suicidal thoughts occur. Advised trial of melatonin two hours before bed. We will continue to monitor. FMLA paperwork completed with extension. Follow up in 6 weeks.

## 2024-04-08 NOTE — Telephone Encounter (Unsigned)
 Copied from CRM [id number]. Topic: General - Other >> Apr 08, 2024  3:42 PM Alexandria E wrote: Reason for CRM: Patient called stating that [employer] received her records, but the attending physician statement was faxed over blank. Patient will need this to be re-faxed to [phone]. Initial request was 11/25, and the deadline is 12/23. Please call patient if additional questions. The request was for medical records 01/2024-present.

## 2024-04-12 NOTE — Telephone Encounter (Signed)
 Forms faxed to the fax number provided on the paper work [phone] and pt has been called and informed

## 2024-04-12 NOTE — Telephone Encounter (Unsigned)
 Copied from CRM [id number]. Topic: General - Other >> Apr 08, 2024  3:42 PM Alexandria E wrote: Reason for CRM: Patient called stating that [employer] received her records, but the attending physician statement was faxed over blank. Patient will need this to be re-faxed to [phone]. Initial request was 11/25, and the deadline is 12/23. Please call patient if additional questions. The request was for medical records 01/2024-present. >> Apr 12, 2024 11:04 AM Mia F wrote: Pt called again stating she need the attending physician statement to be sent to [facility]. She says medical records from October 2025 to present. Called [employer] due to pt being upset that this is not compltete. [employer], states they will print out paper work and CMA will give pt a call

## 2024-04-28 ENCOUNTER — Encounter: Payer: Self-pay | Admitting: Nurse Practitioner

## 2024-04-28 ENCOUNTER — Ambulatory Visit: Payer: Self-pay | Admitting: Nurse Practitioner

## 2024-04-28 VITALS — BP 121/77 | HR 62 | Temp 98.2°F | Ht 67.0 in | Wt 174.4 lb

## 2024-04-28 DIAGNOSIS — F4321 Adjustment disorder with depressed mood: Secondary | ICD-10-CM

## 2024-04-28 DIAGNOSIS — F5104 Psychophysiologic insomnia: Secondary | ICD-10-CM | POA: Diagnosis not present

## 2024-04-28 MED ORDER — SERTRALINE HCL 100 MG PO TABS: 100.0000 mg | ORAL_TABLET | Freq: Every day | ORAL | 3 refills | Status: AC

## 2024-04-28 NOTE — Assessment & Plan Note (Addendum)
 Her sleep and mood have improved with medication, but further improvement is desired. No side effects reported. Increase Zoloft  to 100 mg daily at bedtime. Take two 50 mg tablets until the current supply is exhausted, then switch to 100 mg tablets. Follow-up in six weeks to assess response. Counseled patient on common side effects. Encouraged to contact if worsening symptoms, unusual behavior changes or suicidal thoughts occur. Work note provided releasing patient back to work on Monday January 12, with the following recommendation- allowed 2 x 15 minute breaks when needed per 8 hour shift.
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
## 2024-06-14 ENCOUNTER — Ambulatory Visit: Admitting: Nurse Practitioner

## 2024-06-28 ENCOUNTER — Ambulatory Visit: Admitting: Nurse Practitioner
